# Patient Record
Sex: Male | Born: 1985
Health system: Southern US, Community
[De-identification: ages and names within clinical notes are randomized; demographics above are authoritative.]

## PROBLEM LIST (undated history)

## (undated) DIAGNOSIS — J302 Other seasonal allergic rhinitis: Secondary | ICD-10-CM

## (undated) DIAGNOSIS — I4510 Unspecified right bundle-branch block: Secondary | ICD-10-CM

## (undated) DIAGNOSIS — T7840XA Allergy, unspecified, initial encounter: Secondary | ICD-10-CM

## (undated) DIAGNOSIS — E786 Lipoprotein deficiency: Secondary | ICD-10-CM

## (undated) DIAGNOSIS — E785 Hyperlipidemia, unspecified: Secondary | ICD-10-CM

## (undated) HISTORY — PX: ADENOIDECTOMY: SUR15

## (undated) HISTORY — DX: Other seasonal allergic rhinitis: J30.2

## (undated) HISTORY — DX: Unspecified right bundle-branch block: I45.10

## (undated) HISTORY — DX: Lipoprotein deficiency: E78.6

## (undated) HISTORY — DX: Allergy, unspecified, initial encounter: T78.40XA

## (undated) HISTORY — DX: Hyperlipidemia, unspecified: E78.5

---
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
## 2007-09-03 ENCOUNTER — Ambulatory Visit: Payer: Self-pay | Admitting: Family Medicine

## 2008-09-15 ENCOUNTER — Ambulatory Visit: Payer: Self-pay | Admitting: Family Medicine

## 2011-09-23 ENCOUNTER — Ambulatory Visit (INDEPENDENT_AMBULATORY_CARE_PROVIDER_SITE_OTHER): Payer: BC Managed Care – PPO | Admitting: Medical

## 2011-09-23 ENCOUNTER — Encounter: Payer: Self-pay | Admitting: Medical

## 2011-09-23 VITALS — BP 140/80 | HR 72 | Temp 98.0°F | Ht 70.0 in | Wt 221.5 lb

## 2011-09-23 DIAGNOSIS — Z0000 Encounter for general adult medical examination without abnormal findings: Secondary | ICD-10-CM

## 2011-09-23 LAB — POCT URINALYSIS DIPSTICK
Bilirubin, UA: NEGATIVE
Blood, UA: NEGATIVE
Glucose, UA: NEGATIVE
Ketones, UA: NEGATIVE
Leukocytes, UA: NEGATIVE
Nitrite, UA: NEGATIVE
Protein, UA: NEGATIVE
Spec Grav, UA: 1.01
Urobilinogen, UA: NEGATIVE
pH, UA: 8

## 2011-09-23 NOTE — Patient Instructions (Signed)
Preventative Care for Adults, Male       REGULAR HEALTH EXAMS:  A routine yearly physical is a good way to check in with your primary care provider about your health and preventive screening. It is also an opportunity to share updates about your health and any concerns you have, and receive a thorough all-over exam.   Most health insurance companies pay for at least some preventative services.  Check with your health plan for specific coverages.  WHAT PREVENTATIVE SERVICES DO MEN NEED?  Adult men should have their weight and blood pressure checked regularly.   Men age 35 and older should have their cholesterol levels checked regularly.  Beginning at age 50 and continuing to age 75, men should be screened for colorectal cancer.  Certain people should may need continued testing until age 85.  Other cancer screening may include exams for testicular and prostate cancer.  Updating vaccinations is part of preventative care.  Vaccinations help protect against diseases such as the flu.  Lab tests are generally done as part of preventative care to screen for anemia and blood disorders, to screen for problems with the kidneys and liver, to screen for bladder problems, to check blood sugar, and to check your cholesterol level.  Preventative services generally include counseling about diet, exercise, avoiding tobacco, drugs, excessive alcohol consumption, and sexually transmitted infections.    GENERAL RECOMMENDATIONS FOR GOOD HEALTH:  Healthy diet:  Eat a variety of foods, including fruit, vegetables, animal or vegetable protein, such as meat, fish, chicken, and eggs, or beans, lentils, tofu, and grains, such as rice.  Drink plenty of water daily.  Decrease saturated fat in the diet, avoid lots of red meat, processed foods, sweets, fast foods, and fried foods.  Exercise:  Aerobic exercise helps maintain good heart health. At least 30-40 minutes of moderate-intensity exercise is recommended.  For example, a brisk walk that increases your heart rate and breathing. This should be done on most days of the week.   Find a type of exercise or a variety of exercises that you enjoy so that it becomes a part of your daily life.  Examples are running, walking, swimming, water aerobics, and biking.  For motivation and support, explore group exercise such as aerobic class, spin class, Zumba, Yoga,or  martial arts, etc.    Set exercise goals for yourself, such as a certain weight goal, walk or run in a race such as a 5k walk/run.  Speak to your primary care provider about exercise goals.  Disease prevention:  If you smoke or chew tobacco, find out from your caregiver how to quit. It can literally save your life, no matter how long you have been a tobacco user. If you do not use tobacco, never begin.   Maintain a healthy diet and normal weight. Increased weight leads to problems with blood pressure and diabetes.   The Body Mass Index or BMI is a way of measuring how much of your body is fat. Having a BMI above 27 increases the risk of heart disease, diabetes, hypertension, stroke and other problems related to obesity. Your caregiver can help determine your BMI and based on it develop an exercise and dietary program to help you achieve or maintain this important measurement at a healthful level.  High blood pressure causes heart and blood vessel problems.  Persistent high blood pressure should be treated with medicine if weight loss and exercise do not work.   Fat and cholesterol leaves deposits in your arteries   that can block them. This causes heart disease and vessel disease elsewhere in your body.  If your cholesterol is found to be high, or if you have heart disease or certain other medical conditions, then you may need to have your cholesterol monitored frequently and be treated with medication.   Ask if you should have a stress test if your history suggests this. A stress test is a test done on  a treadmill that looks for heart disease. This test can find disease prior to there being a problem.  Avoid drinking alcohol in excess (more than two drinks per day).  Avoid use of street drugs. Do not share needles with anyone. Ask for professional help if you need assistance or instructions on stopping the use of alcohol, cigarettes, and/or drugs.  Brush your teeth twice a day with fluoride toothpaste, and floss once a day. Good oral hygiene prevents tooth decay and gum disease. The problems can be painful, unattractive, and can cause other health problems. Visit your dentist for a routine oral and dental check up and preventive care every 6-12 months.   Look at your skin regularly.  Use a mirror to look at your back. Notify your caregivers of changes in moles, especially if there are changes in shapes, colors, a size larger than a pencil eraser, an irregular border, or development of new moles.  Safety:  Use seatbelts 100% of the time, whether driving or as a passenger.  Use safety devices such as hearing protection if you work in environments with loud noise or significant background noise.  Use safety glasses when doing any work that could send debris in to the eyes.  Use a helmet if you ride a bike or motorcycle.  Use appropriate safety gear for contact sports.  Talk to your caregiver about gun safety.  Use sunscreen with a SPF (or skin protection factor) of 15 or greater.  Lighter skinned people are at a greater risk of skin cancer. Don't forget to also wear sunglasses in order to protect your eyes from too much damaging sunlight. Damaging sunlight can accelerate cataract formation.   Practice safe sex. Use condoms. Condoms are used for birth control and to help reduce the spread of sexually transmitted infections (or STIs).  Some of the STIs are gonorrhea (the clap), chlamydia, syphilis, trichomonas, herpes, HPV (human papilloma virus) and HIV (human immunodeficiency virus) which causes AIDS.  The herpes, HIV and HPV are viral illnesses that have no cure. These can result in disability, cancer and death.   Keep carbon monoxide and smoke detectors in your home functioning at all times. Change the batteries every 6 months or use a model that plugs into the wall.   Vaccinations:  Stay up to date with your tetanus shots and other required immunizations. You should have a booster for tetanus every 10 years. Be sure to get your flu shot every year, since 5%-20% of the [place] population comes down with the flu. The flu vaccine changes each year, so being vaccinated once is not enough. Get your shot in the fall, before the flu season peaks.   Other vaccines to consider:  Pneumococcal vaccine to protect against certain types of pneumonia.  This is normally recommended for adults age 65 or older.  However, adults younger than 25 years old with certain underlying conditions such as diabetes, heart or lung disease should also receive the vaccine.  Shingles vaccine to protect against Varicella Zoster if you are older than age 60, or younger   than 25 years old with certain underlying illness.  Hepatitis A vaccine to protect against a form of infection of the liver by a virus acquired from food.  Hepatitis B vaccine to protect against a form of infection of the liver by a virus acquired from blood or body fluids, particularly if you work in health care.  If you plan to travel internationally, check with your local health department for specific vaccination recommendations.  Cancer Screening:  Most routine colon cancer screening begins at the age of 50. On a yearly basis, doctors may provide special easy to use take-home tests to check for hidden blood in the stool. Sigmoidoscopy or colonoscopy can detect the earliest forms of colon cancer and is life saving. These tests use a small camera at the end of a tube to directly examine the colon. Speak to your caregiver about this at age 50, when routine  screening begins (and is repeated every 5 years unless early forms of pre-cancerous polyps or small growths are found).   At the age of 50 men usually start screening for prostate cancer every year. Screening may begin at a younger age for those with higher risk. Those at higher risk include African-Americans or having a family history of prostate cancer. There are two types of tests for prostate cancer:   Prostate-specific antigen (PSA) testing. Recent studies raise questions about prostate cancer using PSA and you should discuss this with your caregiver.   Digital rectal exam (in which your doctor's lubricated and gloved finger feels for enlargement of the prostate through the anus).   Screening for testicular cancer.  Do a monthly exam of your testicles. Gently roll each testicle between your thumb and fingers, feeling for any abnormal lumps. The best time to do this is after a hot shower or bath when the tissues are looser. Notify your caregivers of any lumps, tenderness or changes in size or shape immediately.     

## 2011-09-23 NOTE — Progress Notes (Signed)
Subjective:   HPI  Cory Barron is a 25 y.o. male who presents for a complete physical.  He is a Company secretary and works with EMS as well.  He has been in his usual state of health without c/o.    Reviewed their medical, surgical, family, social, medication, and allergy history and updated chart as appropriate.  Past Medical History  Diagnosis Date  . Asthma     childhood asthma    History reviewed. No pertinent past surgical history.  Family History  Problem Relation Age of Onset  . Thyroid disease Mother   . Hypertension Father   . Asthma Brother   . Diabetes Maternal Aunt   . Cancer Maternal Grandmother     stomach cancer  . Diabetes Maternal Grandfather   . Cancer Paternal Grandfather     lung  . Heart disease Paternal Grandfather   . Stroke Neg Hx     History   Social History  . Marital Status: Single    Spouse Name: N/A    Number of Children: N/A  . Years of Education: N/A   Occupational History  . Not on file.   Social History Main Topics  . Smoking status: Never Smoker   . Smokeless tobacco: Former Neurosurgeon  . Alcohol Use: 0.5 oz/week    1 drink(s) per week  . Drug Use: No  . Sexually Active: Not on file     fireman, married, no children, exercise - some   Other Topics Concern  . Not on file   Social History Narrative  . No narrative on file    No current outpatient prescriptions on file prior to visit.    No Known Allergies   Review of Systems Constitutional: denies fever, chills, sweats, unexpected weight change, anorexia, fatigue Allergy: negative; denies recent sneezing, itching, congestion Dermatology: denies changing moles, rash, lumps, new worrisome lesions ENT: no runny nose, ear pain, sore throat, hoarseness, sinus pain, teeth pain, tinnitus, hearing loss, epistaxis Cardiology: denies chest pain, palpitations, edema, orthopnea, paroxysmal nocturnal dyspnea Respiratory: denies cough, shortness of breath, dyspnea on exertion, wheezing,  hemoptysis Gastroenterology: denies abdominal pain, nausea, vomiting, diarrhea, constipation, blood in stool, changes in bowel movement, dysphagia Hematology: denies bleeding or bruising problems Musculoskeletal: denies arthralgias, myalgias, joint swelling, back pain, neck pain, cramping, gait changes Ophthalmology: denies vision changes, eye redness, itching, discharge Urology: denies dysuria, difficulty urinating, hematuria, urinary frequency, urgency, incontinence Neurology: no headache, weakness, tingling, numbness, speech abnormality, memory loss, falls, dizziness Psychology: denies depressed mood, agitation, sleep problems     Objective:   Physical Exam  Filed Vitals:   09/23/11 1401  BP: 140/80  Pulse: 72  Temp: 98 F (36.7 C)    General appearance: alert, no distress, WD/WN, white male, looks stated age Skin: scattered benign appearing macules throughout, otherwise tattoos on bilat shoulders, chest, and upper back; fireman tattoo right shoulder, 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 On back HEENT: normocephalic, conjunctiva/corneas normal, sclerae anicteric, PERRLA, EOMi, nares patent, no discharge or erythema, pharynx normal Oral cavity: MMM, tongue normal, teeth in good repair Neck: supple, no lymphadenopathy, no thyromegaly, no masses, normal ROM, no bruits Chest: non tender, normal shape and expansion Heart: RRR, normal S1, S2, no murmurs Lungs: CTA bilaterally, no wheezes, rhonchi, or rales Abdomen: +bs, soft, non tender, non distended, no masses, no hepatomegaly, no splenomegaly, no bruits Back: non tender, normal ROM, no scoliosis Musculoskeletal: upper extremities non tender, no obvious deformity, normal ROM throughout, lower extremities non tender, no obvious deformity,  normal ROM throughout Extremities: no edema, no cyanosis, no clubbing Pulses: 2+ symmetric, upper and lower extremities, normal cap refill Neurological: alert, oriented x 3, CN2-12 intact, strength  normal upper extremities and lower extremities, sensation normal throughout, DTRs 2+ throughout, no cerebellar signs, gait normal Psychiatric: normal affect, behavior normal, pleasant  GU: normal male external genitalia, no mass, no hernia, no lesions   Assessment :    Encounter Diagnosis  Name Primary?  . Routine general medical examination at a health care facility Yes      Plan:    Physical exam - discussed healthy lifestyle, diet, exercise, preventative care, vaccinations, and addressed their concerns.   He will get flu shot through work soon. Tdap 09/03/07.     BP recheck today was 136/78 with large cuff.  Advised he try and lose 10-20 lb over the next 75mo.  I reviewed his fasting labs from 4/12 which were normal for CMET, CBC, Lipid, heavy metal screen.  Urinalysis normal today.    Recheck in 1 year.

## 2012-04-15 ENCOUNTER — Telehealth: Payer: Self-pay | Admitting: Medical

## 2012-04-16 NOTE — Telephone Encounter (Signed)
Make sure i have them.   i didn't see them on my desk

## 2012-04-20 ENCOUNTER — Encounter: Payer: Self-pay | Admitting: Medical

## 2012-09-23 ENCOUNTER — Encounter: Payer: Self-pay | Admitting: Internal Medicine

## 2012-09-29 ENCOUNTER — Encounter: Payer: Self-pay | Admitting: Medical

## 2012-09-29 ENCOUNTER — Ambulatory Visit (INDEPENDENT_AMBULATORY_CARE_PROVIDER_SITE_OTHER): Payer: BC Managed Care – PPO | Admitting: Medical

## 2012-09-29 VITALS — BP 118/80 | HR 92 | Temp 97.9°F | Resp 16 | Ht 69.2 in | Wt 212.0 lb

## 2012-09-29 DIAGNOSIS — L819 Disorder of pigmentation, unspecified: Secondary | ICD-10-CM

## 2012-09-29 DIAGNOSIS — Z0000 Encounter for general adult medical examination without abnormal findings: Secondary | ICD-10-CM

## 2012-09-29 DIAGNOSIS — Z113 Encounter for screening for infections with a predominantly sexual mode of transmission: Secondary | ICD-10-CM

## 2012-09-29 DIAGNOSIS — L818 Other specified disorders of pigmentation: Secondary | ICD-10-CM

## 2012-09-29 NOTE — Progress Notes (Signed)
Subjective:   HPI  Cory Barron is a 26 y.o. male who presents for a complete physical.  He is a Company secretary and works with EMS as well.  He has been in his usual state of health without c/o.    He does have questions above cancer screens.  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  Has questions about screening.  No other c/o.  Reviewed their medical, surgical, family, social, medication, and allergy history and updated chart as appropriate.  Past Medical History  Diagnosis Date  . Asthma     childhood asthma  . Seasonal allergic rhinitis     spring    Past Surgical History  Procedure Date  . Adenoidectomy     Family History  Problem Relation Age of Onset  . Thyroid disease Mother   . Hypertension Father   . Asthma Brother   . Diabetes Maternal Aunt   . Cancer Maternal Grandmother     stomach cancer  . Diabetes Maternal Grandfather   . Cancer Paternal Grandfather     lung  . Heart disease Paternal Grandfather   . Stroke Neg Hx   . Cancer Maternal Uncle     rectal    History   Social History  . Marital Status: Single    Spouse Name: N/A    Number of Children: N/A  . Years of Education: N/A   Occupational History  . fireman, emt    Social History Main Topics  . Smoking status: Never Smoker   . Smokeless tobacco: Former Neurosurgeon  . Alcohol Use: 0.5 oz/week    1 drink(s) per week  . Drug Use: No  . Sexually Active: Not on file     fireman, married, no children, exercise - some   Other Topics Concern  . Not on file   Social History Narrative   Married, active on job, some exercise outside of work, no children    Current Outpatient Prescriptions on File Prior to Visit  Medication Sig Dispense Refill  . Multiple Vitamins-Minerals (MULTIVITAMIN WITH MINERALS) tablet Take 1 tablet by mouth daily.          No Known Allergies   Review of Systems Constitutional: denies fever, chills,  sweats, unexpected weight change, anorexia, fatigue Allergy: negative; denies recent sneezing, itching, congestion Dermatology: denies changing moles, rash, lumps, new worrisome lesions ENT: no runny nose, ear pain, sore throat, hoarseness, sinus pain, teeth pain, tinnitus, hearing loss, epistaxis Cardiology: denies chest pain, palpitations, edema, orthopnea, paroxysmal nocturnal dyspnea Respiratory: denies cough, shortness of breath, dyspnea on exertion, wheezing, hemoptysis Gastroenterology: denies abdominal pain, nausea, vomiting, diarrhea, constipation, blood in stool, changes in bowel movement, dysphagia Hematology: denies bleeding or bruising problems Musculoskeletal: denies arthralgias, myalgias, joint swelling, back pain, neck pain, cramping, gait changes Ophthalmology: denies vision changes, eye redness, itching, discharge Urology: denies dysuria, difficulty urinating, hematuria, urinary frequency, urgency, incontinence Neurology: no headache, weakness, tingling, numbness, speech abnormality, memory loss, falls, dizziness Psychology: denies depressed mood, agitation, sleep problems     Objective:   Physical Exam  Filed Vitals:   09/29/12 1406  BP: 118/80  Pulse: 92  Temp: 97.9 F (36.6 C)  Resp: 16    General appearance: alert, no distress, WD/WN, white male Skin: scattered benign appearing macules throughout, otherwise tattoos on bilat shoulders, chest, and upper back; fireman tattoo right shoulder, 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 On back HEENT: normocephalic, conjunctiva/corneas normal, sclerae anicteric, PERRLA, EOMi,  nares patent, no discharge or erythema, pharynx normal Oral cavity: MMM, tongue normal, teeth in good repair Neck: supple, no lymphadenopathy, no thyromegaly, no masses, normal ROM, no bruits Chest: non tender, normal shape and expansion Heart: RRR, normal S1, S2, no murmurs Lungs: CTA bilaterally, no wheezes, rhonchi, or rales Abdomen: +bs, soft, non  tender, non distended, no masses, no hepatomegaly, no splenomegaly, no bruits Back: non tender, normal ROM, no scoliosis Musculoskeletal: upper extremities non tender, no obvious deformity, normal ROM throughout, lower extremities non tender, no obvious deformity, normal ROM throughout Extremities: no edema, no cyanosis, no clubbing Pulses: 2+ symmetric, upper and lower extremities, normal cap refill Neurological: alert, oriented x 3, CN2-12 intact, strength normal upper extremities and lower extremities, sensation normal throughout, DTRs 2+ throughout, no cerebellar signs, gait normal Psychiatric: normal affect, behavior normal, pleasant  GU: normal male external genitalia, no mass, no hernia, no lesions Rectal Deferred  Assessment :    Encounter Diagnoses  Name Primary?  . Routine general medical examination at a health care facility Yes  . Screen for STD (sexually transmitted disease)   . Tattoo of skin       Plan:    Physical exam - discussed healthy lifestyle, diet, exercise, preventative care, vaccinations, and addressed their concerns.   He will get flu shot through work soon. Tdap 09/03/07.   Pending hepatitis screening, will begin Hep A/B vaccination.  Will request labs from work (comprehensive panel).  STD screening today.  Low risk, but no prior screening.  Tattoo of skin - hepatitis screening today

## 2012-09-29 NOTE — Patient Instructions (Signed)
Preventative Care for Adults, Male       REGULAR HEALTH EXAMS:  A routine yearly physical is a good way to check in with your primary care provider about your health and preventive screening. It is also an opportunity to share updates about your health and any concerns you have, and receive a thorough all-over exam.   Most health insurance companies pay for at least some preventative services.  Check with your health plan for specific coverages.  WHAT PREVENTATIVE SERVICES DO MEN NEED?  Adult men should have their weight and blood pressure checked regularly.   Men age 26 and older should have their cholesterol levels checked regularly.  Beginning at age 26 and continuing to age 26, men should be screened for colorectal cancer.  Certain people should may need continued testing until age 26.  Other cancer screening may include exams for testicular and prostate cancer.  Updating vaccinations is part of preventative care.  Vaccinations help protect against diseases such as the flu.  Lab tests are generally done as part of preventative care to screen for anemia and blood disorders, to screen for problems with the kidneys and liver, to screen for bladder problems, to check blood sugar, and to check your cholesterol level.  Preventative services generally include counseling about diet, exercise, avoiding tobacco, drugs, excessive alcohol consumption, and sexually transmitted infections.    GENERAL RECOMMENDATIONS FOR GOOD HEALTH:  Healthy diet:  Eat a variety of foods, including fruit, vegetables, animal or vegetable protein, such as meat, fish, chicken, and eggs, or beans, lentils, tofu, and grains, such as rice.  Drink plenty of water daily.  Decrease saturated fat in the diet, avoid lots of red meat, processed foods, sweets, fast foods, and fried foods.  Exercise:  Aerobic exercise helps maintain good heart health. At least 30-40 minutes of moderate-intensity exercise is recommended.  For example, a brisk walk that increases your heart rate and breathing. This should be done on most days of the week.   Find a type of exercise or a variety of exercises that you enjoy so that it becomes a part of your daily life.  Examples are running, walking, swimming, water aerobics, and biking.  For motivation and support, explore group exercise such as aerobic class, spin class, Zumba, Yoga,or  martial arts, etc.    Set exercise goals for yourself, such as a certain weight goal, walk or run in a race such as a 5k walk/run.  Speak to your primary care provider about exercise goals.  Disease prevention:  If you smoke or chew tobacco, find out from your caregiver how to quit. It can literally save your life, no matter how long you have been a tobacco user. If you do not use tobacco, never begin.   Maintain a healthy diet and normal weight. Increased weight leads to problems with blood pressure and diabetes.   The Body Mass Index or BMI is a way of measuring how much of your body is fat. Having a BMI above 27 increases the risk of heart disease, diabetes, hypertension, stroke and other problems related to obesity. Your caregiver can help determine your BMI and based on it develop an exercise and dietary program to help you achieve or maintain this important measurement at a healthful level.  High blood pressure causes heart and blood vessel problems.  Persistent high blood pressure should be treated with medicine if weight loss and exercise do not work.   Fat and cholesterol leaves deposits in your arteries   that can block them. This causes heart disease and vessel disease elsewhere in your body.  If your cholesterol is found to be high, or if you have heart disease or certain other medical conditions, then you may need to have your cholesterol monitored frequently and be treated with medication.   Ask if you should have a stress test if your history suggests this. A stress test is a test done on  a treadmill that looks for heart disease. This test can find disease prior to there being a problem.  Avoid drinking alcohol in excess (more than two drinks per day).  Avoid use of street drugs. Do not share needles with anyone. Ask for professional help if you need assistance or instructions on stopping the use of alcohol, cigarettes, and/or drugs.  Brush your teeth twice a day with fluoride toothpaste, and floss once a day. Good oral hygiene prevents tooth decay and gum disease. The problems can be painful, unattractive, and can cause other health problems. Visit your dentist for a routine oral and dental check up and preventive care every 6-12 months.   Look at your skin regularly.  Use a mirror to look at your back. Notify your caregivers of changes in moles, especially if there are changes in shapes, colors, a size larger than a pencil eraser, an irregular border, or development of new moles.  Safety:  Use seatbelts 100% of the time, whether driving or as a passenger.  Use safety devices such as hearing protection if you work in environments with loud noise or significant background noise.  Use safety glasses when doing any work that could send debris in to the eyes.  Use a helmet if you ride a bike or motorcycle.  Use appropriate safety gear for contact sports.  Talk to your caregiver about gun safety.  Use sunscreen with a SPF (or skin protection factor) of 15 or greater.  Lighter skinned people are at a greater risk of skin cancer. Don't forget to also wear sunglasses in order to protect your eyes from too much damaging sunlight. Damaging sunlight can accelerate cataract formation.   Practice safe sex. Use condoms. Condoms are used for birth control and to help reduce the spread of sexually transmitted infections (or STIs).  Some of the STIs are gonorrhea (the clap), chlamydia, syphilis, trichomonas, herpes, HPV (human papilloma virus) and HIV (human immunodeficiency virus) which causes AIDS.  The herpes, HIV and HPV are viral illnesses that have no cure. These can result in disability, cancer and death.   Keep carbon monoxide and smoke detectors in your home functioning at all times. Change the batteries every 6 months or use a model that plugs into the wall.   Vaccinations:  Stay up to date with your tetanus shots and other required immunizations. You should have a booster for tetanus every 10 years. Be sure to get your flu shot every year, since 5%-20% of the [place] population comes down with the flu. The flu vaccine changes each year, so being vaccinated once is not enough. Get your shot in the fall, before the flu season peaks.   Other vaccines to consider:  Pneumococcal vaccine to protect against certain types of pneumonia.  This is normally recommended for adults age 65 or older.  However, adults younger than 26 years old with certain underlying conditions such as diabetes, heart or lung disease should also receive the vaccine.  Shingles vaccine to protect against Varicella Zoster if you are older than age 60, or younger   than 26 years old with certain underlying illness.  Hepatitis A vaccine to protect against a form of infection of the liver by a virus acquired from food.  Hepatitis B vaccine to protect against a form of infection of the liver by a virus acquired from blood or body fluids, particularly if you work in health care.  If you plan to travel internationally, check with your local health department for specific vaccination recommendations.  Cancer Screening:  Most routine colon cancer screening begins at the age of 50. On a yearly basis, doctors may provide special easy to use take-home tests to check for hidden blood in the stool. Sigmoidoscopy or colonoscopy can detect the earliest forms of colon cancer and is life saving. These tests use a small camera at the end of a tube to directly examine the colon. Speak to your caregiver about this at age 50, when routine  screening begins (and is repeated every 5 years unless early forms of pre-cancerous polyps or small growths are found).   At the age of 50 men usually start screening for prostate cancer every year. Screening may begin at a younger age for those with higher risk. Those at higher risk include African-Americans or having a family history of prostate cancer. There are two types of tests for prostate cancer:   Prostate-specific antigen (PSA) testing. Recent studies raise questions about prostate cancer using PSA and you should discuss this with your caregiver.   Digital rectal exam (in which your doctor's lubricated and gloved finger feels for enlargement of the prostate through the anus).   Screening for testicular cancer.  Do a monthly exam of your testicles. Gently roll each testicle between your thumb and fingers, feeling for any abnormal lumps. The best time to do this is after a hot shower or bath when the tissues are looser. Notify your caregivers of any lumps, tenderness or changes in size or shape immediately.     

## 2012-09-30 LAB — GC/CHLAMYDIA PROBE AMP, URINE: Chlamydia, Swab/Urine, PCR: NEGATIVE

## 2012-09-30 LAB — HEPATITIS B SURFACE ANTIBODY, QUANTITATIVE: Hepatitis B-Post: 0 m[IU]/mL

## 2012-09-30 LAB — HIV ANTIBODY (ROUTINE TESTING W REFLEX): HIV: NONREACTIVE

## 2012-10-05 ENCOUNTER — Encounter: Payer: Self-pay | Admitting: Medical

## 2012-10-07 ENCOUNTER — Telehealth: Payer: Self-pay | Admitting: Family Medicine

## 2012-10-07 ENCOUNTER — Encounter: Payer: Self-pay | Admitting: Medical

## 2012-10-07 NOTE — Telephone Encounter (Signed)
SHANE CAN YOU TAKE A LOOK AT THIS?

## 2012-10-09 NOTE — Telephone Encounter (Signed)
He is not immune to Hep B, and HIV, syphilis, gonorrhea, chlamydia negative.  Why did I not get Hep B surface Antigen, core antibody labs?  And why did the other labs not spill into my inbox like normal.    You can advise him of his labs thus far, but yes he will need Hep B vaccine, but I don't have all the labs back yet.  pls check on this.

## 2012-10-09 NOTE — Telephone Encounter (Signed)
LMOM FOR THE PATIENT ABOUT HIS LABS AND TO FOLLOW UP FOR HIS HEP B SERIES. CLS

## 2012-10-14 ENCOUNTER — Other Ambulatory Visit (INDEPENDENT_AMBULATORY_CARE_PROVIDER_SITE_OTHER): Payer: BC Managed Care – PPO

## 2012-10-14 DIAGNOSIS — Z23 Encounter for immunization: Secondary | ICD-10-CM

## 2012-11-11 ENCOUNTER — Other Ambulatory Visit (INDEPENDENT_AMBULATORY_CARE_PROVIDER_SITE_OTHER): Payer: BC Managed Care – PPO

## 2012-11-11 DIAGNOSIS — Z23 Encounter for immunization: Secondary | ICD-10-CM

## 2013-02-06 ENCOUNTER — Other Ambulatory Visit: Payer: Self-pay

## 2013-03-08 ENCOUNTER — Encounter: Payer: Self-pay | Admitting: Medical

## 2013-03-08 ENCOUNTER — Ambulatory Visit: Payer: BC Managed Care – PPO | Admitting: Medical

## 2013-03-08 VITALS — BP 132/78 | HR 62 | Temp 97.8°F | Resp 16 | Wt 226.0 lb

## 2013-03-08 DIAGNOSIS — Z23 Encounter for immunization: Secondary | ICD-10-CM

## 2013-03-08 DIAGNOSIS — M79609 Pain in unspecified limb: Secondary | ICD-10-CM

## 2013-03-08 DIAGNOSIS — S8991XA Unspecified injury of right lower leg, initial encounter: Secondary | ICD-10-CM

## 2013-03-08 DIAGNOSIS — Z029 Encounter for administrative examinations, unspecified: Secondary | ICD-10-CM

## 2013-03-08 NOTE — Patient Instructions (Signed)
Over the next 5-7 days, try and do some leg elevation for 20 minutes twice daily  Take Ibuprofen OTC 200mg , 3 tablets 3 times daily for the next week.  Try heat compresses.  You can also alternate ice and heat  Consider compression with ACE wrap or OTC Compression stockings.    Move, exercise some to keep blood flow moving.

## 2013-03-08 NOTE — Progress Notes (Signed)
Subjective: Here for form to be completed for physical fitness readiness.  He had a complete physical with me a few months ago.  He is here for his last Hep B vaccination in the series of 3.   He is a IT sales professional.   He has a question about a leg injury.  He was at a call last week and coming down a stair landing about 2 ft off the ground at a mobile home, the wood was rotted, and his right leg went through the stair hitting the broken boards and landing had on the ground.  He only has mild pain of right leg below knee at abrasion and bruise, but whole leg is bruised.   He just wanted it looked at to make sure its healing.   He doesn't really have any other pain, no pain with walking, weight bearing, no calve pain.   He did advise his employer of the injury which occurred approximately one week ago.  He initially did not go for care since it wasn't really bothering him.  He denies warmth, fever, SOB, dyspnea. No numbness, tingling.   Past Medical History  Diagnosis Date  . Asthma     childhood asthma  . Seasonal allergic rhinitis     spring   ROS as in subjective  Objective: Gen: wd, wn, nad Skin: right leg from below knee to foot with some mild purplish ecchymosis at right medial foot and heel, ecchymosis faint throughout right lower leg, but also yellowish coloration of the ecchymosis over most of the right lower leg, no warmth, no erythema, there is a small healing 4mm diameter abrasion or right leg medial and inferior to knee MSK: mild tenderness at right lower leg just inferior and medial to knee at site of abrasion, but no diffuse or other localized tenderness, knee and ankle nontender, legs with normal ROM Ext: mild generalized swelling or right lower leg, no palpable cord, -homans Pulses normal Neuro: neurovascularly intact LE  Assessment: Encounter Diagnoses  Name Primary?  . Leg injury, right, initial encounter Yes  . Need for hepatitis B vaccination   . Administrative encounter     Leg injury - advised he notify his employer that he was seen today for this in the event this doesn't resolve in a week.  He did make them aware of the injury prior.  Etiology seems to be soft tissue bruising and swelling from trauma.  Seems to be healing appropriately.  No obvious signs/symptoms of DVT.  Advised elevation, alternate ice/heat, Ibuprofen 600mg  TID, compression, and this should gradually resolve in another week.  If not improving, recheck.  Hep B # 3 given today.  Physical fitness form completed for him at his request.   F/u prn.

## 2013-03-11 ENCOUNTER — Other Ambulatory Visit: Payer: BC Managed Care – PPO

## 2013-10-12 ENCOUNTER — Ambulatory Visit (INDEPENDENT_AMBULATORY_CARE_PROVIDER_SITE_OTHER): Payer: BC Managed Care – PPO | Admitting: Medical

## 2013-10-12 ENCOUNTER — Encounter: Payer: Self-pay | Admitting: Medical

## 2013-10-12 VITALS — BP 122/80 | HR 92 | Temp 97.7°F | Resp 18 | Ht 70.0 in | Wt 222.0 lb

## 2013-10-12 DIAGNOSIS — Z113 Encounter for screening for infections with a predominantly sexual mode of transmission: Secondary | ICD-10-CM

## 2013-10-12 DIAGNOSIS — Z0000 Encounter for general adult medical examination without abnormal findings: Secondary | ICD-10-CM

## 2013-10-12 DIAGNOSIS — Z569 Unspecified problems related to employment: Secondary | ICD-10-CM

## 2013-10-12 DIAGNOSIS — Z579 Occupational exposure to unspecified risk factor: Secondary | ICD-10-CM

## 2013-10-12 LAB — POCT URINALYSIS DIPSTICK
Bilirubin, UA: NEGATIVE
Blood, UA: NEGATIVE
Ketones, UA: NEGATIVE
Leukocytes, UA: NEGATIVE
Protein, UA: NEGATIVE
Spec Grav, UA: <=1.005
pH, UA: 8

## 2013-10-12 NOTE — Progress Notes (Signed)
Subjective:   HPI  Cory Barron is a 27 y.o. male who presents for a complete physical.  Doing well in general.   Expecting his first child in April 2015.   Firefighter, and also in school for additional fire training.   Preventative care: Last ophthalmology visit:N/A Last dental visit:YES [facility] Last colonoscopy:N/A Last prostate exam: N/A Last EKG:02/25/13 Last labs:2013  Prior vaccinations: TD or Tdap:2008 Influenza:WILL GET AT WORK Pneumococcal:N/A Shingles/Zostavax:N/A Other: N/A  Concerns: He worries about reports of other firefighters getting early cancers.   Wants all the appropriate screens yearly that are necessary.  Reviewed their medical, surgical, family, social, medication, and allergy history and updated chart as appropriate.  Past Medical History  Diagnosis Date  . Asthma     childhood asthma  . Seasonal allergic rhinitis     spring    Past Surgical History  Procedure Laterality Date  . Adenoidectomy      History   Social History  . Marital Status: Single    Spouse Name: N/A    Number of Children: N/A  . Years of Education: N/A   Occupational History  . fireman, emt    Social History Main Topics  . Smoking status: Never Smoker   . Smokeless tobacco: Former Neurosurgeon  . Alcohol Use: 0.5 oz/week    1 drink(s) per week  . Drug Use: No  . Sexual Activity: Not on file     Comment: fireman, married, no children, exercise - some   Other Topics Concern  . Not on file   Social History Narrative   Married, active on job, some exercise outside of work, no children yet, Wife due in April 2015.  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  Finishes in 1.5years (2016).    Family History  Problem Relation Age of Onset  . Thyroid disease Mother   . Hypertension Father   . Asthma Brother   . Diabetes Maternal Aunt   . Cancer Maternal Grandmother     stomach cancer  . Diabetes Maternal Grandfather   . Cancer  Paternal Grandfather     lung  . Heart disease Paternal Grandfather   . Stroke Neg Hx   . Cancer Maternal Uncle     rectal    Current outpatient prescriptions:Multiple Vitamins-Minerals (MULTIVITAMIN WITH MINERALS) tablet, Take 1 tablet by mouth daily.  , Disp: , Rfl:   No Known Allergies   Review of Systems Constitutional: -fever, -chills, -sweats, -unexpected weight change, -decreased appetite, -fatigue Allergy: -sneezing, -itching, -congestion Dermatology: -changing moles, --rash, -lumps ENT: -runny nose, -ear pain, -sore throat, -hoarseness, -sinus pain, -teeth pain, - ringing in ears, -hearing loss, -nosebleeds Cardiology: -chest pain, -palpitations, -swelling, -difficulty breathing when lying flat, -waking up short of breath Respiratory: -cough, -shortness of breath, -difficulty breathing with exercise or exertion, -wheezing, -coughing up blood Gastroenterology: -abdominal pain, -nausea, -vomiting, -diarrhea, -constipation, -blood in stool, -changes in bowel movement, -difficulty swallowing or eating Hematology: -bleeding, -bruising  Musculoskeletal: -joint aches, -muscle aches, -joint swelling, -back pain, -neck pain, -cramping, -changes in gait Ophthalmology: denies vision changes, eye redness, itching, discharge Urology: -burning with urination, -difficulty urinating, -blood in urine, -urinary frequency, -urgency, -incontinence Neurology: -headache, -weakness, -tingling, -numbness, -memory loss, -falls, -dizziness Psychology: -depressed mood, -agitation, -sleep problems     Objective:   Physical Exam  BP 122/80  Pulse 92  Temp(Src) 97.7 F (36.5 C) (Oral)  Resp 18  Ht 5\' 10"  (1.778 m)  Wt 222 lb (  100.699 kg)  BMI 31.85 kg/m2   General appearance: alert, no distress, WD/WN, white male  Skin: scattered benign appearing macules throughout, otherwise tattoos on bilat shoulders, chest, and upper back; fireman tattoo right shoulder, 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  On back  HEENT: normocephalic, conjunctiva/corneas normal, sclerae anicteric, PERRLA, EOMi, nares patent, no discharge or erythema, pharynx normal  Oral cavity: MMM, tongue normal, teeth in good repair  Neck: supple, no lymphadenopathy, no thyromegaly, no masses, normal ROM, no bruits  Chest: non tender, normal shape and expansion  Heart: RRR, normal S1, S2, no murmurs  Lungs: CTA bilaterally, no wheezes, rhonchi, or rales  Abdomen: +bs, soft, non tender, non distended, no masses, no hepatomegaly, no splenomegaly, no bruits  Back: non tender, normal ROM, no scoliosis  Musculoskeletal: upper extremities non tender, no obvious deformity, normal ROM throughout, lower extremities non tender, no obvious deformity, normal ROM throughout  Extremities: no edema, no cyanosis, no clubbing  Pulses: 2+ symmetric, upper and lower extremities, normal cap refill  Neurological: alert, oriented x 3, CN2-12 intact, strength normal upper extremities and lower extremities, sensation normal throughout, DTRs 2+ throughout, no cerebellar signs, gait normal  Psychiatric: normal affect, behavior normal, pleasant  GU: normal male external genitalia, circumcised, no mass, no hernia, no lesions  Rectal Deferred   Assessment and Plan :    Encounter Diagnoses  Name Primary?  . Routine general medical examination at a health care facility Yes  . Venereal disease screening   . Occupational exposure in workplace    Physical exam - discussed healthy lifestyle, diet, exercise, preventative care, vaccinations, and addressed their concerns.   Reviewed his work based general labs from 03/2013, additional labs due to occupational exposures today.   Will research other screening and get back in touch with him. Follow-up pending labs.

## 2013-10-13 LAB — HEPATITIS B SURFACE ANTIGEN: Hepatitis B Surface Ag: NEGATIVE

## 2013-10-14 ENCOUNTER — Telehealth: Payer: Self-pay | Admitting: Medical

## 2013-10-14 NOTE — Telephone Encounter (Signed)
Let him know that I have reviewed his email and links.  I have already spoken with [facility], am awaiting call back from Dr. Cyndie Chime, one of our wonderful local oncologists.  I will call him back when I get some feedback.  If he hasn't heard from me in 2 wk, then call back.

## 2013-10-15 NOTE — Telephone Encounter (Signed)
Left message for pt word by word

## 2013-10-28 ENCOUNTER — Other Ambulatory Visit: Payer: Self-pay

## 2013-11-02 ENCOUNTER — Telehealth: Payer: Self-pay | Admitting: Medical

## 2013-11-08 ENCOUNTER — Ambulatory Visit
Admission: RE | Admit: 2013-11-08 | Discharge: 2013-11-08 | Disposition: A | Discharge status: Home / Self Care | Payer: No Typology Code available for payment source | Source: Ambulatory Visit | Attending: Occupational Medicine | Admitting: Occupational Medicine

## 2013-11-08 ENCOUNTER — Other Ambulatory Visit: Payer: Self-pay | Admitting: Occupational Medicine

## 2013-11-08 DIAGNOSIS — Z021 Encounter for pre-employment examination: Secondary | ICD-10-CM

## 2013-11-08 NOTE — Telephone Encounter (Signed)
I have had a chance to speak to both Employee health as well as Dr. Cyndie Chime who is a well respected oncologist hear in [place].  With both folks I discussed some of the resources he mentioned, some of the recommended tests that were listed, and there is no consensus.  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  The oncologist recommended against routine chest x-rays or tumor marker blood tests.  Looking over his resources he gave me, they recommend yearly panel of labs to include liver, kidney, blood count, cholesterol, yearly PFTs, chest x-ray every 3 years, colonoscopy beginning age 58 instead of age 54, yearly testicular exams, Pap and mammograms for females, they also recommended screening labs for lymphoma bladder cancer and multiple myeloma.   I would say that we currently do this because we check a urine for blood which is a screen for bladder cancer, we check a CBC with differential which is a screen for lymphoma as well as a physical exam of lymph nodes, we check a metabolic panel which looks for elevated calcium which is one of the tests were used to screen for multiple myeloma, and we did a testicular exam. I believe he has had a PFT within the last year. If not we can certainly do that if he has any concern. We also checked an HIV and hepatitis lab.  Otherwise I would not recommending any other tests at this time since there is no good data on the value of the screenings.  See if this helps answer his questions.  If he learns of other more concrete guidelines going forward then we can certainly re look at this.

## 2013-11-12 ENCOUNTER — Encounter: Payer: Self-pay | Admitting: Family Medicine

## 2013-11-12 NOTE — Telephone Encounter (Signed)
Patient is aware. He saw everything on [facility] and he will call back if has any further questions. CLS

## 2013-11-12 NOTE — Telephone Encounter (Signed)
lmom to cb. CLS 

## 2013-12-23 DIAGNOSIS — E786 Lipoprotein deficiency: Secondary | ICD-10-CM

## 2013-12-23 HISTORY — DX: Lipoprotein deficiency: E78.6

## 2014-02-25 ENCOUNTER — Encounter: Payer: Self-pay | Admitting: Medical

## 2014-02-25 ENCOUNTER — Ambulatory Visit (INDEPENDENT_AMBULATORY_CARE_PROVIDER_SITE_OTHER): Payer: 59 | Admitting: Medical

## 2014-02-25 VITALS — BP 120/80 | HR 64 | Temp 98.4°F | Resp 14 | Wt 203.0 lb

## 2014-02-25 DIAGNOSIS — K5289 Other specified noninfective gastroenteritis and colitis: Secondary | ICD-10-CM

## 2014-02-25 DIAGNOSIS — R112 Nausea with vomiting, unspecified: Secondary | ICD-10-CM

## 2014-02-25 DIAGNOSIS — K529 Noninfective gastroenteritis and colitis, unspecified: Secondary | ICD-10-CM

## 2014-02-25 DIAGNOSIS — R197 Diarrhea, unspecified: Secondary | ICD-10-CM

## 2014-02-25 MED ORDER — ONDANSETRON HCL 4 MG PO TABS: 4.0000 mg | ORAL_TABLET | Freq: Three times a day (TID) | ORAL | Status: DC | PRN

## 2014-02-25 NOTE — Patient Instructions (Signed)
Thank you for giving me the opportunity to serve you today.    Your diagnosis today includes: Encounter Diagnoses  Name Primary?  . Gastroenteritis Yes  . Nausea with vomiting   . Diarrhea      Specific recommendations today include:  Over the weekend you can use Pepto Bismol or Imodium if not fever or blood in the stool  Use BRAT diet/bland foods such as bread, rice, applesauce toast  Avoid spicy and greasy foods  For nausea, you can use Emetrol or Benadryl OTC, or the Zofran I sent to the pharmacy  Wash hands frequently  Follow up: if needed   I have included other useful information below for your review.   Viral Gastroenteritis Viral gastroenteritis is also known as stomach flu. This condition affects the stomach and intestinal tract. It can cause sudden diarrhea and vomiting. The illness typically lasts 3 to 8 days. Most people develop an immune response that eventually gets rid of the virus. While this natural response develops, the virus can make you quite ill. CAUSES  Many different viruses can cause gastroenteritis, such as rotavirus or noroviruses. You can catch one of these viruses by consuming contaminated food or water. You may also catch a virus by sharing utensils or other personal items with an infected person or by touching a contaminated surface. SYMPTOMS  The most common symptoms are diarrhea and vomiting. These problems can cause a severe loss of body fluids (dehydration) and a body salt (electrolyte) imbalance. Other symptoms may include:  Fever.  Headache.  Fatigue.  Abdominal pain. DIAGNOSIS  Your caregiver can usually diagnose viral gastroenteritis based on your symptoms and a physical exam. A stool sample may also be taken to test for the presence of viruses or other infections. TREATMENT  This illness typically goes away on its own. Treatments are aimed at rehydration. The most serious cases of viral gastroenteritis involve vomiting so  severely that you are not able to keep fluids down. In these cases, fluids must be given through an intravenous line (IV). HOME CARE INSTRUCTIONS   Drink enough fluids to keep your urine clear or pale yellow. Drink small amounts of fluids frequently and increase the amounts as tolerated.  Ask your caregiver for specific rehydration instructions.  Avoid:  Foods high in sugar.  Alcohol.  Carbonated drinks.  Tobacco.  Juice.  Caffeine drinks.  Extremely hot or cold fluids.  Fatty, greasy foods.  Too much intake of anything at one time.  Dairy products until 24 to 48 hours after diarrhea stops.  You may consume probiotics. Probiotics are active cultures of beneficial bacteria. They may lessen the amount and number of diarrheal stools in adults. Probiotics can be found in yogurt with active cultures and in supplements.  Wash your hands well to avoid spreading the virus.  Only take over-the-counter or prescription medicines for pain, discomfort, or fever as directed by your caregiver. Do not give aspirin to children. Antidiarrheal medicines are not recommended.  Ask your caregiver if you should continue to take your regular prescribed and over-the-counter medicines.  Keep all follow-up appointments as directed by your caregiver. SEEK IMMEDIATE MEDICAL CARE IF:   You are unable to keep fluids down.  You do not urinate at least once every 6 to 8 hours.  You develop shortness of breath.  You notice blood in your stool or vomit. This may look like coffee grounds.  You have abdominal pain that increases or is concentrated in one small area (localized).  You have persistent vomiting or diarrhea.  You have a fever.  The patient is a child younger than 3 months, and he or she has a fever.  The patient is a child older than 3 months, and he or she has a fever and persistent symptoms.  The patient is a child older than 3 months, and he or she has a fever and symptoms  suddenly get worse.  The patient is a baby, and he or she has no tears when crying. MAKE SURE YOU:   Understand these instructions.  Will watch your condition.  Will get help right away if you are not doing well or get worse. Document Released: 12/09/2005 Document Revised: 03/02/2012 Document Reviewed: 09/25/2011 [facility] Patient Information 2014 [place].

## 2014-02-25 NOTE — Progress Notes (Signed)
   Subjective:   Cory Barron is a 28 y.o. male presenting on 02/25/2014 with Nausea, Emesis and Diarrhea  Been having symptoms for a week, and since this has persisted, wanted to come in.    In the last 24 hours, has had at least 5 x of diarrhea, loose watery, no blood.  No fever.  Vomited 2 x in the last 24 hours.   Vomiting started yesterday.  Has had diarrhea several times daily the last week.   Wednesday this week felt a little better, but things have persisted.   Denies fever, body aches, chills. No recent antibiotics, no recent travel.  No sick contacts with similar. No recent camping.   Doesn't recall any foods to cause food poisoning.   No hx/o IBS or bowel problems.  No other aggravating or relieving factors.  No other complaint.  Review of Systems ROS as in subjective      Objective:    Filed Vitals:   02/25/14 1515  BP: 120/80  Pulse: 64  Temp: 98.4 F (36.9 C)  Resp: 14    General appearance: alert, no distress, WD/WN HEENT: normocephalic, sclerae anicteric, TMs pearly, nares patent, no discharge or erythema, pharynx normal Oral cavity: MMM, no lesions Neck: supple, no lymphadenopathy, no thyromegaly, no masses Heart: RRR, normal S1, S2, no murmurs Lungs: CTA bilaterally, no wheezes, rhonchi, or rales Abdomen: +bs, soft, non tender, non distended, no masses, no hepatomegaly, no splenomegaly Pulses: 2+ symmetric, upper and lower extremities, normal cap refill      Assessment: Encounter Diagnoses  Name Primary?  . Gastroenteritis Yes  . Nausea with vomiting   . Diarrhea      Plan: Given no red flag symptoms and normal exam, will still presume viral gastroenteritis.  Gave recommendations including hydration, bland diet, small portions, avoid greasy and fried foods, can use Pepto Bismol, rest,probiotic, and if not back to normal by Monday, call back.   Cory Barron was seen today for nausea, emesis and diarrhea.  Diagnoses and associated orders for this  visit:  Gastroenteritis  Nausea with vomiting  Diarrhea  Other Orders - ondansetron (ZOFRAN) 4 MG tablet; Take 1 tablet (4 mg total) by mouth every 8 (eight) hours as needed for nausea or vomiting.     Return if symptoms worsen or fail to improve.

## 2014-05-24 ENCOUNTER — Encounter: Payer: Self-pay | Admitting: Medical

## 2014-06-14 ENCOUNTER — Encounter: Payer: Self-pay | Admitting: Medical

## 2014-10-17 ENCOUNTER — Ambulatory Visit (INDEPENDENT_AMBULATORY_CARE_PROVIDER_SITE_OTHER): Payer: 59 | Admitting: Medical

## 2014-10-17 ENCOUNTER — Encounter: Payer: Self-pay | Admitting: Medical

## 2014-10-17 VITALS — BP 120/70 | HR 80 | Temp 98.1°F | Resp 16 | Ht 69.2 in | Wt 219.0 lb

## 2014-10-17 DIAGNOSIS — Z0000 Encounter for general adult medical examination without abnormal findings: Secondary | ICD-10-CM

## 2014-10-17 DIAGNOSIS — Z7729 Contact with and (suspected ) exposure to other hazardous substances: Secondary | ICD-10-CM

## 2014-10-17 LAB — POCT URINALYSIS DIPSTICK
BILIRUBIN UA: NEGATIVE
Blood, UA: NEGATIVE
GLUCOSE UA: NEGATIVE
Ketones, UA: NEGATIVE
LEUKOCYTES UA: NEGATIVE
Nitrite, UA: NEGATIVE
Protein, UA: NEGATIVE
SPEC GRAV UA: 1.015
Urobilinogen, UA: NEGATIVE
pH, UA: 7

## 2014-10-17 NOTE — Progress Notes (Signed)
Subjective:   HPI  Cory Barron is a 28 y.o. male who presents for a complete physical.   Preventative care: Last ophthalmology visit: n/a Last dental visit:yes Last colonoscopy:n/a Last prostate exam: n/a Last EKG:03/2014 Last labs: 02/2014, gets yealry panel of labs, spirometry and EKG at work in spring.  Prior vaccinations: TD or Tdap:2008 Influenza:2015 at work Pneumococcal:n/a Shingles/Zostavax:n/a  Advanced directive:n/a [place] will:n/a  Concerns: None, doing fine, has a 73mo son.  Reviewed their medical, surgical, family, social, medication, and allergy history and updated chart as appropriate.  Past Medical History  Diagnosis Date  . Asthma     childhood asthma  . Seasonal allergic rhinitis     spring  . RBBB     normal variant on EKG    Past Surgical History  Procedure Laterality Date  . Adenoidectomy      History   Social History  . Marital Status: Single    Spouse Name: N/A    Number of Children: N/A  . Years of Education: N/A   Occupational History  . fireman, emt    Social History Main Topics  . Smoking status: Never Smoker   . Smokeless tobacco: Former Systems developer  . Alcohol Use: 0.5 oz/week    1 drink(s) per week  . Drug Use: No  . Sexual Activity: Not on file     Comment: fireman, married, no children, exercise - some   Other Topics Concern  . 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    Family History  Problem Relation Age of Onset  . Thyroid disease Mother   . Hypertension Father   . Asthma Brother   . Diabetes Maternal Aunt   . Cancer Maternal Grandmother     stomach cancer  . Diabetes Maternal Grandfather   . Cancer Paternal Grandfather     lung  . Heart disease Paternal Grandfather   . Stroke Neg Hx   .  Cancer Maternal Uncle     rectal    Current outpatient prescriptions:Multiple Vitamins-Minerals (MULTIVITAMIN WITH MINERALS) tablet, Take 1 tablet by mouth daily.  , Disp: , Rfl:   No Known Allergies   Review of Systems Constitutional: -fever, -chills, -sweats, -unexpected weight change, -decreased appetite, -fatigue Allergy: -sneezing, -itching, -congestion Dermatology: -changing moles, --rash, -lumps ENT: -runny nose, -ear pain, -sore throat, -hoarseness, -sinus pain, -teeth pain, - ringing in ears, -hearing loss, -nosebleeds Cardiology: -chest pain, -palpitations, -swelling, -difficulty breathing when lying flat, -waking up short of breath Respiratory: -cough, -shortness of breath, -difficulty breathing with exercise or exertion, -wheezing, -coughing up blood Gastroenterology: -abdominal pain, -nausea, -vomiting, -diarrhea, -constipation, -blood in stool, -changes in bowel movement, -difficulty swallowing or eating Hematology: -bleeding, -bruising  Musculoskeletal: -joint aches, -muscle aches, -joint swelling, -back pain, -neck pain, -cramping, -changes in gait Ophthalmology: denies vision changes, eye redness, itching, discharge Urology: -burning with urination, -difficulty urinating, -blood in urine, -urinary frequency, -urgency, -incontinence Neurology: -headache, -weakness, -tingling, -numbness, -memory loss, -falls, -dizziness Psychology: -depressed mood, -agitation, -sleep problems     Objective:   Physical Exam  BP 120/70  Pulse 80  Temp(Src) 98.1 F (36.7 C) (Oral)  Resp 16  Ht 5' 9.2" (1.758 m)  Wt 219 lb (99.338 kg)  BMI 32.14 kg/m2  Wt Readings from Last 3 Encounters:  10/17/14 219 lb (99.338 kg)  02/25/14 203 lb (92.08 kg)  10/12/13 222 lb (100.699 kg)   General appearance: alert, no distress, WD/WN, white male  Skin: scattered benign appearing macules throughout, otherwise tattoos on bilat shoulders, chest, and upper back; fireman tattoo right shoulder, man  with outstretched arms/Jesus? On back  HEENT: normocephalic, conjunctiva/corneas normal, sclerae anicteric, PERRLA, EOMi, nares patent, no discharge or erythema, pharynx normal  Oral cavity: MMM, tongue normal, teeth in good repair  Neck: supple, no lymphadenopathy, no thyromegaly, no masses, normal ROM, no bruits  Chest: non tender, normal shape and expansion  Heart: RRR, normal S1, S2, no murmurs  Lungs: CTA bilaterally, no wheezes, rhonchi, or rales  Abdomen: +bs, soft, non tender, non distended, no masses, no hepatomegaly, no splenomegaly, no bruits  Back: non tender, normal ROM, no scoliosis  Musculoskeletal: upper extremities non tender, no obvious deformity, normal ROM throughout, lower extremities non tender, no obvious deformity, normal ROM throughout  Extremities: no edema, no cyanosis, no clubbing  Pulses: 2+ symmetric, upper and lower extremities, normal cap refill  Neurological: alert, oriented x 3, CN2-12 intact, strength normal upper extremities and lower extremities, sensation normal throughout, DTRs 2+ throughout, no cerebellar signs, gait normal  Psychiatric: normal affect, behavior normal, pleasant  GU: normal male external genitalia, circumcised, no mass, no hernia, no lesions  Rectal Deferred    Assessment and Plan :    Encounter Diagnoses  Name Primary?  . Encounter for health maintenance examination in adult Yes  . Exposure to hazardous substance     Physical exam - discussed healthy lifestyle, diet, exercise, preventative care, vaccinations, and addressed their concerns.   Congratulated him on his son's birth.    Labs today due to health exposures of his job.  Reviewed EKG and labs from 02/2014 health screen at work. See your dentist yearly for routine dental care including hygiene visits twice yearly. Work on losing some weight. Follow-up pending labs

## 2014-10-18 LAB — RPR

## 2014-10-18 LAB — HEPATITIS C ANTIBODY: HCV Ab: NEGATIVE

## 2014-10-18 LAB — HEPATITIS B SURFACE ANTIBODY, QUANTITATIVE: Hepatitis B-Post: 86.4 m[IU]/mL

## 2014-10-18 LAB — HIV ANTIBODY (ROUTINE TESTING W REFLEX): HIV: NONREACTIVE

## 2015-04-03 LAB — PULMONARY FUNCTION TEST

## 2015-04-04 ENCOUNTER — Telehealth: Payer: Self-pay | Admitting: Family Medicine

## 2015-04-04 NOTE — Telephone Encounter (Signed)
Pt stopped by and dropped off labs and ekg. He had these done at work and wanted a copy for his file. I am sending back in your folder.

## 2015-04-11 ENCOUNTER — Encounter: Payer: Self-pay | Admitting: Medical

## 2015-10-25 ENCOUNTER — Ambulatory Visit (INDEPENDENT_AMBULATORY_CARE_PROVIDER_SITE_OTHER): Payer: Commercial Managed Care - HMO | Admitting: Medical

## 2015-10-25 ENCOUNTER — Encounter: Payer: Self-pay | Admitting: Medical

## 2015-10-25 VITALS — BP 130/80 | HR 68 | Temp 98.0°F | Ht 69.25 in | Wt 208.0 lb

## 2015-10-25 DIAGNOSIS — E786 Lipoprotein deficiency: Secondary | ICD-10-CM

## 2015-10-25 DIAGNOSIS — Z0000 Encounter for general adult medical examination without abnormal findings: Secondary | ICD-10-CM

## 2015-10-25 LAB — POCT URINALYSIS DIPSTICK
Bilirubin, UA: NEGATIVE
GLUCOSE UA: NEGATIVE
KETONES UA: NEGATIVE
Leukocytes, UA: NEGATIVE
Nitrite, UA: NEGATIVE
Protein, UA: NEGATIVE
RBC UA: NEGATIVE
Spec Grav, UA: 1.025
UROBILINOGEN UA: NEGATIVE
pH, UA: 7

## 2015-10-25 NOTE — Progress Notes (Signed)
Subjective:   HPI  Cory Barron is a 29 y.o. male who presents for a complete physical.   Preventative care: Last ophthalmology visit: gets eye exam through work Last dental visit:yes Last colonoscopy:n/a Last prostate exam: n/a Last EKG:03/2015 Last labs: 02/2014, gets yearly panel of labs, spirometry and EKG at work in spring.  Prior vaccinations: TD or Tdap:2008 Influenza:2016 at work Pneumococcal:n/a Shingles/Zostavax:n/a  Advanced directive:n/a [place] will:n/a  Concerns: None, doing fine, has a 48mo son.  Reviewed their medical, surgical, family, social, medication, and allergy history and updated chart as appropriate.  Past Medical History  Diagnosis Date  . Seasonal allergic rhinitis     spring  . RBBB     normal variant on EKG  . Asthma     childhood asthma  . Low HDL (under 40) 2015    Past Surgical History  Procedure Laterality Date  . Adenoidectomy      Social History   Social History  . Marital Status: Single    Spouse Name: N/A  . Number of Children: N/A  . Years of Education: N/A   Occupational History  . fireman, emt    Social History Main Topics  . Smoking status: Never Smoker   . Smokeless tobacco: Former Systems developer  . Alcohol Use: 0.5 oz/week    1 Standard drinks or equivalent per week  . Drug Use: No  . Sexual Activity: Not on file     Comment: fireman, married, no children, exercise - some   Other Topics Concern  . 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  Lives in [place].  Wife is nurse at [employer]    Family History  Problem Relation Age of Onset  . Thyroid disease Mother   . Hypertension Father   . Asthma Brother   . Diabetes Maternal Aunt   . Cancer Maternal Grandmother     stomach cancer  .  Diabetes Maternal Grandfather   . Cancer Paternal Grandfather     lung  . Heart disease Paternal Grandfather   . Stroke Neg Hx   . Cancer Maternal Uncle     rectal     Current outpatient prescriptions:  Marland Kitchen  Multiple Vitamins-Minerals (MULTIVITAMIN WITH MINERALS) tablet, Take 1 tablet by mouth daily.  , Disp: , Rfl:   No Known Allergies   Review of Systems Constitutional: -fever, -chills, -sweats, -unexpected weight change, -decreased appetite, -fatigue Allergy: -sneezing, -itching, -congestion Dermatology: -changing moles, --rash, -lumps ENT: -runny nose, -ear pain, -sore throat, -hoarseness, -sinus pain, -teeth pain, - ringing in ears, -hearing loss, -nosebleeds Cardiology: -chest pain, -palpitations, -swelling, -difficulty breathing when lying flat, -waking up short of breath Respiratory: -cough, -shortness of breath, -difficulty breathing with exercise or exertion, -wheezing, -coughing up blood Gastroenterology: -abdominal pain, -nausea, -vomiting, -diarrhea, -constipation, -blood in stool, -changes in bowel movement, -difficulty swallowing or eating Hematology: -bleeding, -bruising  Musculoskeletal: -joint aches, -muscle aches, -joint swelling, -back pain, -neck pain, -cramping, -changes in gait Ophthalmology: denies vision changes, eye redness, itching, discharge Urology: -burning with urination, -difficulty urinating, -blood in urine, -urinary frequency, -urgency, -incontinence Neurology: -headache, -weakness, -tingling, -numbness, -memory loss, -falls, -dizziness Psychology: -depressed mood, -agitation, -sleep problems     Objective:   Physical Exam  BP 130/80 mmHg  Pulse 68  Temp(Src) 98 F (36.7  C) (Oral)  Ht 5' 9.25" (1.759 m)  Wt 208 lb (94.348 kg)  BMI 30.49 kg/m2  SpO2 98%  Wt Readings from Last 3 Encounters:  10/25/15 208 lb (94.348 kg)  10/17/14 219 lb (99.338 kg)  02/25/14 203 lb (92.08 kg)   General appearance: alert, no distress, WD/WN, white male   Skin: scattered benign appearing macules throughout, otherwise tattoos on bilat shoulders, chest, and upper back; fireman tattoo right shoulder, man with outstretched arms/Jesus? On back  HEENT: normocephalic, conjunctiva/corneas normal, sclerae anicteric, PERRLA, EOMi, nares patent, no discharge or erythema, pharynx normal  Oral cavity: MMM, tongue normal, teeth in good repair  Neck: supple, no lymphadenopathy, no thyromegaly, no masses, normal ROM, no bruits  Chest: non tender, normal shape and expansion  Heart: RRR, normal S1, S2, no murmurs  Lungs: CTA bilaterally, no wheezes, rhonchi, or rales  Abdomen: +bs, soft, non tender, non distended, no masses, no hepatomegaly, no splenomegaly, no bruits  Back: non tender, normal ROM, no scoliosis  Musculoskeletal: upper extremities non tender, no obvious deformity, normal ROM throughout, lower extremities non tender, no obvious deformity, normal ROM throughout  Extremities: no edema, no cyanosis, no clubbing  Pulses: 2+ symmetric, upper and lower extremities, normal cap refill  Neurological: alert, oriented x 3, CN2-12 intact, strength normal upper extremities and lower extremities, sensation normal throughout, DTRs 2+ throughout, no cerebellar signs, gait normal  Psychiatric: normal affect, behavior normal, pleasant  GU: normal male external genitalia, circumcised, left glans penis with stellate scar unchanged for years per patient, no mass, no hernia, no lesions  Rectal Deferred    Assessment and Plan :    Encounter Diagnoses  Name Primary?  . Routine general medical examination at a health care facility Yes  . Low HDL (under 40)     Physical exam - discussed healthy lifestyle, diet, exercise, preventative care, vaccinations, and addressed their concerns.   Reviewed his 03/2015 labs, EKG and PFTs he gets at work. See your dentist yearly for routine dental care including hygiene visits twice yearly. Work on losing some [employer] Work on  dietary changes as discussed for low HDL F/u yearly for physical

## 2015-10-25 NOTE — Patient Instructions (Signed)
Cholesterol  Cholesterol is a fat. Your body needs a small amount of cholesterol. Cholesterol may build up in your blood vessels. This increases your chance of having a heart attack or stroke.  You cannot feel your cholesterol levels. The only way to know your cholesterol level is high is with a blood test. Keep your test results. Work with your doctor to keep your cholesterol at a good level.  WHAT DO THE TEST RESULTS MEAN?  · Total cholesterol is how much cholesterol is in your blood.  · LDL is bad cholesterol. This is the type that can build up. You want LDL to be low.  · HDL is good cholesterol. It cleans your blood vessels and carries LDL away. You want HDL to be high.  · Triglycerides are fat that the body can burn for energy or store.  WHAT ARE GOOD LEVELS OF CHOLESTEROL?  · Total cholesterol below 200.  · LDL below 100 for people at risk. Below 70 for those at very high risk.  · HDL above 50 is good. Above 60 is best.  · Triglycerides below 150.  HOW CAN I LOWER MY CHOLESTEROL?  · Diet. Follow your diet programs as told by your doctor.    Choose fish, white meat chicken, roasted turkey, or baked turkey. Try not to eat red meat, fried foods, or processed meats such as sausage and lunch meats.    Eat lots of fresh fruits and vegetables.    Choose whole grains, beans, pasta, potatoes, and cereals.    Use only small amounts of olive, corn, or canola oils.    Try not to eat butter, mayonnaise, shortening, or palm kernel oils.    Try not to eat foods with trans fats.    Drink skim or nonfat milk. Eat low-fat or nonfat yogurt and cheeses. Try not to drink whole milk or cream. Try not to eat ice cream, egg yolks, and full-fat cheeses.    Healthy desserts include angel food cake, ginger snaps, animal crackers, hard candy, popsicles, and low-fat or nonfat frozen yogurt. Try not to eat pastries, cakes, pies, and cookies.  · Exercise. Follow your exercise programs as told by your doctor.    Be more active. You can try  gardening, walking, or taking the stairs. Ask your doctor about how you can be more active.  · Medicine. Take medicine as told by your doctor.     This information is not intended to replace advice given to you by your health care provider. Make sure you discuss any questions you have with your health care provider.     Document Released: 03/07/2009 Document Revised: 12/30/2014 Document Reviewed: 09/22/2013  Elsevier Interactive Patient Education ©2016 Elsevier Inc[employer]

## 2015-11-14 ENCOUNTER — Ambulatory Visit (INDEPENDENT_AMBULATORY_CARE_PROVIDER_SITE_OTHER): Payer: Commercial Managed Care - HMO | Admitting: Medical

## 2015-11-14 ENCOUNTER — Encounter: Payer: Self-pay | Admitting: Medical

## 2015-11-14 VITALS — BP 120/58 | HR 100 | Wt 211.0 lb

## 2015-11-14 DIAGNOSIS — K921 Melena: Secondary | ICD-10-CM

## 2015-11-14 DIAGNOSIS — K648 Other hemorrhoids: Secondary | ICD-10-CM

## 2015-11-14 LAB — POCT HEMOGLOBIN: HEMOGLOBIN: 15.4 g/dL (ref 14.1–18.1)

## 2015-11-14 NOTE — Progress Notes (Signed)
Subjective: Chief Complaint  Patient presents with  . blood in stool    about a week ago. bright stool. said its only happened once. orthostatic vitals done   Here for c/o blood in stool.  Last week when wiping one day saw bright red blood on toilet paper, some dripped in bowl as well.   Only happened once.  Has family hx/o colon cancer, so worried about this.  When the blood happened, it was a particularly harder stool to pass.   Otherwise no other prior episode of blood in stool.   Has BM once daily typically. Normally smooth and formed.  No blood mixed in with stool.  He also noted about a month ago begin constipation 4 days.   Had used some magnesium citrated during that time.   He does lift things but no straining worse than usual.  Has tried to be more mindful of health diet of late.  Denies any recent beets or dark colored foods, no recent pepto bismol, no rectal pain, no hemorrhoids.  Takes ibuprofen occasionally, but not regularly, and no other frequently NSAID use.   Not drinking alcohol.  No prior similar.  No other aggravating or relieving factors. No other complaint.  Past Medical History  Diagnosis Date  . Seasonal allergic rhinitis     spring  . RBBB     normal variant on EKG  . Asthma     childhood asthma  . Low HDL (under 40) 2015   Family History  Problem Relation Age of Onset  . Thyroid disease Mother   . Hypertension Father   . Asthma Brother   . Diabetes Maternal Aunt   . Cancer Maternal Grandmother     stomach cancer  . Diabetes Maternal Grandfather   . Cancer Paternal Grandfather     lung  . Heart disease Paternal Grandfather   . Stroke Neg Hx   . Cancer Maternal Uncle     rectal   ROS as in subjective   Objective: BP 120/58 mmHg  Pulse 100  Wt 211 lb (95.709 kg)  General appearance: alert, no distress, WD/WN Abdomen: +bs, soft, non tender, non distended, no masses, no hepatomegaly, no splenomegaly Pulses: 2+ symmetric Anus normal appearing, no  fissure or external hemorrhoids Anoscopy performed after he gave consent - 1 small internal non bloody hemorrhoid seen, but no other blood or abnormality, patient tolerated this well   Assessment: Encounter Diagnoses  Name Primary?  . Blood in stool Yes  . Internal hemorrhoid     Plan: discussed concerns, findings.   Hgb fingerstick normal today.   Sent home with stool cards x 3 to use weekly x 3 weeks.  Return these.  He has 1 single brief episode of BRBPR that is likely due to internal hemorrhoids.   Advised if he continues to see blood, or if occult cards are +, then we would go further with eval, but for now, will presume this is due to internal hemorrhoids. discussed water intake, fiber intake, and recheck if problems with hemorrhoids or recurrent issues.

## 2016-02-13 ENCOUNTER — Telehealth: Payer: Self-pay | Admitting: Medical

## 2016-02-13 LAB — PULMONARY FUNCTION TEST

## 2016-02-13 NOTE — Telephone Encounter (Signed)
Pt came in and dropped of results done at his job. I am sending back for review.

## 2016-02-14 NOTE — Telephone Encounter (Signed)
I have reviewed his labs.   His blood counts are fine, lytes, liver, kidney normal, cholesterol numbers ok, as before HDL good cholesterol could be a little better, but is improved from prior lab.  Arsenic, mercury, lead normal, lung function/PFTs normal.  Hope he is doing well, and tell him thanks for bringing these by.

## 2016-02-14 NOTE — Telephone Encounter (Signed)
Pt is aware.  

## 2016-02-15 ENCOUNTER — Encounter: Payer: Self-pay | Admitting: Medical

## 2016-08-23 ENCOUNTER — Telehealth: Payer: Self-pay

## 2016-08-23 LAB — PULMONARY FUNCTION TEST

## 2016-08-23 NOTE — Telephone Encounter (Signed)
Pt dropped off blood work and spirometry for you to review. Placed in your folder for review. Cory Barron

## 2016-08-27 NOTE — Telephone Encounter (Signed)
LMTCB. Victorino December

## 2016-08-27 NOTE — Telephone Encounter (Signed)
Spirometry was normal.  Labs only show low HDL cholesterol, but everything else tests was fine.    I assume he has 10/2016 physical scheduled?

## 2016-08-28 NOTE — Telephone Encounter (Signed)
Pt notified. Said he will have to look at his schedule and call us back to schedule CPE. Victorino December

## 2016-09-05 ENCOUNTER — Encounter: Payer: Self-pay | Admitting: Medical

## 2017-02-25 ENCOUNTER — Ambulatory Visit (INDEPENDENT_AMBULATORY_CARE_PROVIDER_SITE_OTHER): Payer: Commercial Managed Care - HMO | Admitting: Medical

## 2017-02-25 ENCOUNTER — Encounter: Payer: Self-pay | Admitting: Medical

## 2017-02-25 VITALS — BP 128/80 | HR 96 | Ht 69.5 in | Wt 205.8 lb

## 2017-02-25 DIAGNOSIS — Z8349 Family history of other endocrine, nutritional and metabolic diseases: Secondary | ICD-10-CM | POA: Insufficient documentation

## 2017-02-25 DIAGNOSIS — Z23 Encounter for immunization: Secondary | ICD-10-CM | POA: Diagnosis not present

## 2017-02-25 DIAGNOSIS — Z0000 Encounter for general adult medical examination without abnormal findings: Secondary | ICD-10-CM | POA: Diagnosis not present

## 2017-02-25 LAB — POCT URINALYSIS DIPSTICK
Bilirubin, UA: NEGATIVE
Glucose, UA: NEGATIVE
KETONES UA: NEGATIVE
Leukocytes, UA: NEGATIVE
PH UA: 7.5
PROTEIN UA: NEGATIVE
RBC UA: NEGATIVE
SPEC GRAV UA: 1.015
UROBILINOGEN UA: NEGATIVE

## 2017-02-25 NOTE — Progress Notes (Signed)
Subjective:   HPI  Cory Barron is a 31 y.o. male who presents for a complete physical.   Concerns: None, doing fine, has a 37mo son.  Eating healthier, is careful with diet, exercise.  Has lost weight since last physical but can't seem to get below current weight.  Reviewed their medical, surgical, family, social, medication, and allergy history and updated chart as appropriate.  Past Medical History:  Diagnosis Date  . Asthma    childhood asthma  . Low HDL (under 40) 2015  . RBBB    normal variant on EKG  . Seasonal allergic rhinitis    spring    Past Surgical History:  Procedure Laterality Date  . ADENOIDECTOMY      Social History   Social History  . Marital status: Single    Spouse name: N/A  . Number of children: N/A  . Years of education: N/A   Occupational History  . fireman, [employer]   Social History Main Topics  . Smoking status: Never Smoker  . Smokeless tobacco: Former Systems developer  . Alcohol use No  . Drug use: No  . Sexual activity: Not on file     Comment: fireman, married, no children, exercise - some   Other Topics Concern  . 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  Lives in [place].  Wife is nurse at [employer]  02/2017    Family History  Problem Relation Age of Onset  . Thyroid disease Mother   . Hypertension Father   . Asthma Brother   . Diabetes Maternal Aunt   . Cancer Maternal Grandmother     stomach cancer  . Diabetes Maternal Grandfather   . Cancer Paternal Grandfather     lung  . Heart disease Paternal Grandfather   . Cancer Maternal Uncle 81    rectal  . Heart disease Paternal Grandmother     CHF  . Stroke Neg Hx      Current Outpatient Prescriptions:  Marland Kitchen  Multiple Vitamins-Minerals (MULTIVITAMIN WITH MINERALS) tablet, Take 1 tablet by mouth daily.  ,  Disp: , Rfl:   No Known Allergies   Review of Systems Constitutional: -fever, -chills, -sweats, -unexpected weight change, -decreased appetite, -fatigue Allergy: -sneezing, -itching, -congestion Dermatology: -changing moles, --rash, -lumps ENT: -runny nose, -ear pain, -sore throat, -hoarseness, -sinus pain, -teeth pain, - ringing in ears, -hearing loss, -nosebleeds Cardiology: -chest pain, -palpitations, -swelling, -difficulty breathing when lying flat, -waking up short of breath Respiratory: -cough, -shortness of breath, -difficulty breathing with exercise or exertion, -wheezing, -coughing up blood Gastroenterology: -abdominal pain, -nausea, -vomiting, -diarrhea, -constipation, -blood in stool, -changes in bowel movement, -difficulty swallowing or eating Hematology: -bleeding, -bruising  Musculoskeletal: -joint aches, -muscle aches, -joint swelling, -back pain, -neck pain, -cramping, -changes in gait Ophthalmology: denies vision changes, eye redness, itching, discharge Urology: -burning with urination, -difficulty urinating, -blood in urine, -urinary frequency, -urgency, -incontinence Neurology: -headache, -weakness, -tingling, -numbness, -memory loss, -falls, -dizziness Psychology: -depressed mood, -agitation, -sleep problems     Objective:   Physical Exam  BP 128/80   Pulse 96   Ht 5' 9.5" (1.765 m)   Wt 205 lb 12.8 oz (93.4 kg)   SpO2 96%   BMI 29.96 kg/m   Wt Readings from Last 3 Encounters:  02/25/17 205 lb 12.8 oz (93.4 kg)  11/14/15 211 lb (  95.7 kg)  10/25/15 208 lb (94.3 kg)   General appearance: alert, no distress, WD/WN, white male  Skin: Left cheek just lateral to nose, with 85mm pearly papule, and smaller 26mm pink to pearly papule, scattered benign appearing macules throughout, otherwise tattoos on bilat shoulders, chest, and upper back; fireman tattoo right shoulder, man with outstretched arms/Jesus On back  HEENT: normocephalic, conjunctiva/corneas normal, sclerae  anicteric, PERRLA, EOMi, nares patent, no discharge or erythema, pharynx normal  Oral cavity: MMM, tongue normal, teeth in good repair  Neck: supple, no lymphadenopathy, no thyromegaly, no masses, normal ROM, no bruits  Chest: non tender, normal shape and expansion  Heart: RRR, normal S1, S2, no murmurs  Lungs: CTA bilaterally, no wheezes, rhonchi, or rales  Abdomen: +bs, soft, non tender, non distended, no masses, no hepatomegaly, no splenomegaly, no bruits  Back: non tender, normal ROM, no scoliosis  Musculoskeletal: upper extremities non tender, no obvious deformity, normal ROM throughout, lower extremities non tender, no obvious deformity, normal ROM throughout  Extremities: no edema, no cyanosis, no clubbing  Pulses: 2+ symmetric, upper and lower extremities, normal cap refill  Neurological: alert, oriented x 3, CN2-12 intact, strength normal upper extremities and lower extremities, sensation normal throughout, DTRs 2+ throughout, no cerebellar signs, gait normal  Psychiatric: normal affect, behavior normal, pleasant  GU: normal male external genitalia, circumcised, left glans penis with stellate scar unchanged for years per patient, no mass, no hernia, no lesions  Rectal Deferred    Assessment and Plan :    Encounter Diagnoses  Name Primary?  . Routine general medical examination at a health care facility Yes  . Need for TD vaccine   . Family history of thyroid disease     Physical exam - discussed healthy lifestyle, diet, exercise, preventative care, vaccinations, and addressed their concerns.   Advised monthly self testicular exams. Reviewed his 08/2016 labs,  PFTs he gets at work.  TSH level today for screening. See your dentist yearly for routine dental care including hygiene visits twice yearly. Work on losing some weight, discussed strategies with target heart rate, diet tracking, pedometer.   He is not interested in medications Work on dietary changes as discussed for low  HDL Counseled on the Td (tetanus, diptheria) vaccine.  Vaccine information sheet given. Td vaccine given after consent obtained. F/u yearly for physical He had flu shot 08/2016 at work.

## 2017-02-25 NOTE — Addendum Note (Signed)
Addended by: Tyrone Apple on: 02/25/2017 11:57 AM   Modules accepted: Orders

## 2017-02-25 NOTE — Addendum Note (Signed)
Addended by: Tyrone Apple on: 02/25/2017 11:53 AM   Modules accepted: Orders

## 2017-02-26 LAB — TSH: TSH: 0.69 m[IU]/L (ref 0.40–4.50)

## 2017-11-28 ENCOUNTER — Telehealth: Payer: Self-pay | Admitting: Medical

## 2017-11-28 NOTE — Telephone Encounter (Signed)
Pt dropped off labs he had them taking at work for his CPE at work he wanted to have put him his file and wanted you look over them he can be reached at [phone]

## 2017-11-28 NOTE — Telephone Encounter (Signed)
Put in your folder

## 2017-12-03 ENCOUNTER — Encounter: Payer: Self-pay | Admitting: Medical

## 2018-03-11 ENCOUNTER — Ambulatory Visit: Payer: Commercial Managed Care - HMO | Admitting: Medical

## 2018-03-11 ENCOUNTER — Encounter: Payer: Self-pay | Admitting: Medical

## 2018-03-11 VITALS — BP 124/78 | HR 86 | Temp 98.0°F | Ht 68.25 in | Wt 194.4 lb

## 2018-03-11 DIAGNOSIS — S161XXA Strain of muscle, fascia and tendon at neck level, initial encounter: Secondary | ICD-10-CM | POA: Diagnosis not present

## 2018-03-11 DIAGNOSIS — Z579 Occupational exposure to unspecified risk factor: Secondary | ICD-10-CM | POA: Diagnosis not present

## 2018-03-11 DIAGNOSIS — Z0000 Encounter for general adult medical examination without abnormal findings: Secondary | ICD-10-CM | POA: Diagnosis not present

## 2018-03-11 DIAGNOSIS — Z113 Encounter for screening for infections with a predominantly sexual mode of transmission: Secondary | ICD-10-CM

## 2018-03-11 NOTE — Progress Notes (Signed)
Subjective:   HPI  Cory Barron is a 32 y.o. male who presents for Chief Complaint  Patient presents with  . Annual Exam    no concerns     Medical care team includes: Tysinger, Camelia Eng, PA-C here for primary care Dentist Eye doctor  Concerns: Wants STD screening given potential for exposure at work.   Has crick in neck 2 wk, getting better, but not completely resolved.   Reviewed their medical, surgical, family, social, medication, and allergy history and updated chart as appropriate.  Past Medical History:  Diagnosis Date  . Asthma    childhood asthma  . Low HDL (under 40) 2015  . RBBB    normal variant on EKG  . Seasonal allergic rhinitis    spring    Past Surgical History:  Procedure Laterality Date  . ADENOIDECTOMY      Social History   Socioeconomic History  . Marital status: Single    Spouse name: Not on file  . Number of children: Not on file  . Years of education: Not on file  . Highest education level: Not on file  Social Needs  . Financial resource strain: Not on file  . Food insecurity - worry: Not on file  . Food insecurity - inability: Not on file  . Transportation needs - medical: Not on file  . Transportation needs - non-medical: Not on file  Occupational History  . Occupation: Agricultural consultant, Arts development officer: [employer]  Tobacco Use  . Smoking status: Never Smoker  . Smokeless tobacco: Former Network engineer and Sexual Activity  . Alcohol use: Yes    Alcohol/week: 0.0 oz    Comment: rarely  . Drug use: No  . Sexual activity: Not on file    Comment: fireman, married, no children, exercise - some  Other Topics Concern  . 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  Wife is nurse at [employer] [facility] [employer]  02/2018    Family History  Problem Relation Age of Onset  . Thyroid  disease Mother   . COPD Mother   . Hypertension Father   . Asthma Brother   . Diabetes Maternal Aunt   . Cancer Maternal Grandmother        stomach cancer  . Diabetes Maternal Grandfather   . Cancer Paternal Grandfather        lung  . Heart disease Paternal Grandfather   . Cancer Maternal Uncle 61       rectal  . Heart disease Paternal Grandmother        CHF  . Stroke Neg Hx      Current Outpatient Medications:  Marland Kitchen  Multiple Vitamins-Minerals (MULTIVITAMIN WITH MINERALS) tablet, Take 1 tablet by mouth daily.  , Disp: , Rfl:   No Known Allergies   Review of Systems Constitutional: -fever, -chills, -sweats, -unexpected weight change, -decreased appetite, -fatigue Allergy: -sneezing, -itching, -congestion Dermatology: -changing moles, --rash, -lumps ENT: -runny nose, -ear pain, -sore throat, -hoarseness, -sinus pain, -teeth pain, - ringing in ears, -hearing loss, -nosebleeds Cardiology: -chest pain, -palpitations, -swelling, -difficulty breathing when lying flat, -waking up short of breath Respiratory: -cough, -shortness of breath, -difficulty breathing with exercise or exertion, -wheezing, -coughing up blood Gastroenterology: -abdominal pain, -nausea, -vomiting, -diarrhea, -constipation, -blood in stool, -changes in bowel movement, -difficulty swallowing or eating Hematology: -bleeding, -  bruising  Musculoskeletal: -joint aches, -muscle aches, -joint swelling, -back pain, +neck pain, -cramping, -changes in gait Ophthalmology: denies vision changes, eye redness, itching, discharge Urology: -burning with urination, -difficulty urinating, -blood in urine, -urinary frequency, -urgency, -incontinence Neurology: -headache, -weakness, -tingling, -numbness, -memory loss, -falls, -dizziness Psychology: -depressed mood, -agitation, -sleep problems     Objective:   BP 124/78 (BP Location: Right Arm, Patient Position: Sitting, Cuff Size: Normal)   Pulse 86   Temp 98 F (36.7 C) (Oral)    Ht 5' 8.25" (1.734 m)   Wt 194 lb 6.4 oz (88.2 kg)   SpO2 97%   BMI 29.34 kg/m   General appearance: alert, no distress, WD/WN, white male  Skin: scattered benign appearing macules throughout, otherwise tattoos on bilat shoulders, chest, and upper back; fireman tattoo right shoulder, man with outstretched arms/Jesus? On back  HEENT: normocephalic, conjunctiva/corneas normal, sclerae anicteric, PERRLA, EOMi, nares patent, no discharge or erythema, pharynx normal  Oral cavity: MMM, tongue normal, teeth in good repair  Neck: supple, no lymphadenopathy, no thyromegaly, no masses, normal ROM, no bruits  Chest: non tender, normal shape and expansion  Heart: RRR, normal S1, S2, no murmurs  Lungs: CTA bilaterally, no wheezes, rhonchi, or rales  Abdomen: +bs, soft, non tender, non distended, no masses, no hepatomegaly, no splenomegaly, no bruits  Back: non tender, normal ROM, no scoliosis  Musculoskeletal: upper extremities non tender, no obvious deformity, normal ROM throughout, lower extremities non tender, no obvious deformity, normal ROM throughout  Extremities: no edema, no cyanosis, no clubbing  Pulses: 2+ symmetric, upper and lower extremities, normal cap refill  Neurological: alert, oriented x 3, CN2-12 intact, strength normal upper extremities and lower extremities, sensation normal throughout, DTRs 2+ throughout, no cerebellar signs, gait normal  Psychiatric: normal affect, behavior normal, pleasant  GU: normal male external genitalia, circumcised, left glans penis with stellate scar unchanged for years per patient, no mass, no hernia, no lesions  Rectal Deferred   Assessment and Plan :    Encounter Diagnoses  Name Primary?  . Encounter for health maintenance examination in adult Yes  . Occupational exposure in workplace   . Screen for STD (sexually transmitted disease)   . Strain of neck muscle, initial encounter     Physical exam - discussed and counseled on healthy  lifestyle, diet, exercise, preventative care, vaccinations, sick and well care, proper use of emergency dept and after hours care, and addressed their concerns.    Reviewed PFT and labs done through work from 10/2017.  Health screening: See your eye doctor yearly for routine vision care. See your dentist yearly for routine dental care including hygiene visits twice yearly.  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   Discussed prostate symptoms as well.  Prostate screening performed: No  Discussed self testicular exam  Vaccinations: Counseled on the following vaccines:  Yearly flu shot  Neck strain - c/t stretching, NSAID OTC prn short.  Declines muscle relaxer today.  He will consider massage therapy.  Call if not resolving within a week.   Nilton was seen today for annual exam.  Diagnoses and all orders for this visit:  Encounter for health maintenance examination in adult -     HIV antibody -     RPR -     GC/Chlamydia Probe Amp -     Hepatitis C antibody -  Hepatitis B surface antigen  Occupational exposure in workplace -     HIV antibody -     RPR -     GC/Chlamydia Probe Amp -     Hepatitis C antibody -     Hepatitis B surface antigen  Screen for STD (sexually transmitted disease) -     HIV antibody -     RPR -     GC/Chlamydia Probe Amp -     Hepatitis C antibody -     Hepatitis B surface antigen  Strain of neck muscle, initial encounter   Follow-up pending labs, yearly for physical

## 2018-03-12 ENCOUNTER — Encounter: Payer: Self-pay | Admitting: Medical

## 2018-03-12 LAB — GC/CHLAMYDIA PROBE AMP
Chlamydia trachomatis, NAA: NEGATIVE
NEISSERIA GONORRHOEAE BY PCR: NEGATIVE

## 2018-03-12 LAB — HEPATITIS B SURFACE ANTIGEN: HEP B S AG: NEGATIVE

## 2018-03-12 LAB — RPR: RPR Ser Ql: NONREACTIVE

## 2018-03-12 LAB — HEPATITIS C ANTIBODY

## 2018-03-12 LAB — HIV ANTIBODY (ROUTINE TESTING W REFLEX): HIV SCREEN 4TH GENERATION: NONREACTIVE

## 2018-03-13 ENCOUNTER — Telehealth: Payer: Self-pay

## 2018-03-13 NOTE — Telephone Encounter (Signed)
Called patient and advised of lab work, no questions or concerns.

## 2018-03-13 NOTE — Telephone Encounter (Signed)
-----   Message from Carlena Hurl, PA-C sent at 03/13/2018  8:14 AM EDT ----- His screen was negative or normal.  Negative for gonorrhea, chlamydia, HIV, syphilis, hepatitis B and C.

## 2018-05-20 LAB — PULMONARY FUNCTION TEST

## 2018-05-29 ENCOUNTER — Encounter: Payer: Self-pay | Admitting: Medical

## 2018-09-04 DIAGNOSIS — Z0289 Encounter for other administrative examinations: Secondary | ICD-10-CM

## 2018-11-10 ENCOUNTER — Encounter: Payer: Self-pay | Admitting: Medical

## 2018-11-10 ENCOUNTER — Ambulatory Visit: Payer: 59 | Admitting: Medical

## 2018-11-10 VITALS — BP 112/70 | HR 87 | Temp 98.1°F | Resp 16 | Ht 68.5 in | Wt 191.8 lb

## 2018-11-10 DIAGNOSIS — R058 Other specified cough: Secondary | ICD-10-CM

## 2018-11-10 DIAGNOSIS — R05 Cough: Secondary | ICD-10-CM

## 2018-11-10 MED ORDER — HYDROCODONE-HOMATROPINE 5-1.5 MG/5ML PO SYRP: 5.0000 mL | ORAL_SOLUTION | Freq: Three times a day (TID) | ORAL | 0 refills | Status: AC | PRN

## 2018-11-10 NOTE — Progress Notes (Signed)
Subjective: Chief Complaint  Patient presents with  . dry cough    dry cough X 3.5 weeks   Here for cough.  He has a 32-year-old son in daycare who is brought home colds and coughs.  He contracted the cold from his son a month ago.  Although he feels better and the illness symptoms have gone away, he has an ongoing dry cough daily nagging.  The cough is nonproductive.  He denies fever, sweats, body aches, chills, no sore throat no headache no fever.  Has been using some Mucinex and DayQuil on and off.  Non-smoker.  Otherwise normal state of health.  No other complaint.  Him and his wife are expecting their second child, a girl, due in late March 2020  Past Medical History:  Diagnosis Date  . Asthma    childhood asthma  . Low HDL (under 40) 2015  . RBBB    normal variant on EKG  . Seasonal allergic rhinitis    spring   Current Outpatient Medications on File Prior to Visit  Medication Sig Dispense Refill  . Multiple Vitamins-Minerals (MULTIVITAMIN WITH MINERALS) tablet Take 1 tablet by mouth daily.       No current facility-administered medications on file prior to visit.      Objective: BP 112/70   Pulse 87   Temp 98.1 F (36.7 C) (Oral)   Resp 16   Ht 5' 8.5" (1.74 m)   Wt 191 lb 12.8 oz (87 kg)   SpO2 96%   BMI 28.74 kg/m   Wt Readings from Last 3 Encounters:  11/10/18 191 lb 12.8 oz (87 kg)  03/11/18 194 lb 6.4 oz (88.2 kg)  02/25/17 205 lb 12.8 oz (93.4 kg)   General appearance: Alert, WD/WN, no distress, well appearing                             Skin: warm, no rash, no diaphoresis                           Head: no sinus tenderness                            Eyes: conjunctiva normal, corneas clear, PERRLA                            Ears: pearly TMs, external ear canals normal                          Nose: septum midline, turbinates swollen, with erythema and no discharge             Mouth/throat: MMM, tongue normal, no pharyngeal erythema      Neck: supple, no adenopathy, no thyromegaly, non tender                          Heart: RRR, normal S1, S2, no murmurs                         Lungs: +mild bronchial breath sounds, no rhonchi, no wheezes, no rales                Extremities: no edema, non tender       Assessment: Encounter Diagnosis  Name Primary?  . Post-viral cough syndrome Yes     Plan: His symptoms and exam suggest just post viral cough.  Advised good water intake, can use the medication below at nighttime the next several nights, and symptoms should gradually resolve.  He does have a history of childhood asthma which may be partly responsible for the inflammation of the lungs.  If not improving or much improved within the next 7 to 10 days we will pursue x-ray.  He will call back and let me know.  I congratulated him on his upcoming child in March  Callahan was seen today for dry cough.  Diagnoses and all orders for this visit:  Post-viral cough syndrome  Other orders -     HYDROcodone-homatropine (HYCODAN) 5-1.5 MG/5ML syrup; Take 5 mLs by mouth every 8 (eight) hours as needed for up to 5 days for cough.

## 2019-03-12 ENCOUNTER — Encounter: Payer: 59 | Admitting: Medical

## 2019-03-16 ENCOUNTER — Encounter: Payer: Self-pay | Admitting: Medical

## 2019-03-16 ENCOUNTER — Other Ambulatory Visit: Payer: Self-pay

## 2019-03-16 ENCOUNTER — Ambulatory Visit (INDEPENDENT_AMBULATORY_CARE_PROVIDER_SITE_OTHER): Payer: 59 | Admitting: Medical

## 2019-03-16 VITALS — BP 120/70 | HR 86 | Temp 98.2°F | Resp 16 | Ht 69.0 in | Wt 196.8 lb

## 2019-03-16 DIAGNOSIS — L659 Nonscarring hair loss, unspecified: Secondary | ICD-10-CM | POA: Diagnosis not present

## 2019-03-16 DIAGNOSIS — Z0000 Encounter for general adult medical examination without abnormal findings: Secondary | ICD-10-CM

## 2019-03-16 LAB — POCT URINALYSIS DIP (PROADVANTAGE DEVICE)
BILIRUBIN UA: NEGATIVE mg/dL
Bilirubin, UA: NEGATIVE
Glucose, UA: 100 mg/dL — AB
LEUKOCYTES UA: NEGATIVE
Nitrite, UA: NEGATIVE
PH UA: 8 (ref 5.0–8.0)
PROTEIN UA: NEGATIVE mg/dL
RBC UA: NEGATIVE
SPECIFIC GRAVITY, URINE: 1.015
Urobilinogen, Ur: NEGATIVE

## 2019-03-16 NOTE — Patient Instructions (Signed)
Recommendations:  Consider Rogaine topically over the counter OR consider calling for appt with [facility]  Get me copy of labs in May.   This past year the lipids and glucose were a little less than desirable  Consider Pescetarian or Vegetarian diet  When ready, let me know about referral to Urology about vasectomy   Lets change strategies and try something that may work better than what you are currently doing   Breakfast You may eat 1 of the following  Smoothie with Almond milk, handful of kale or spinach, and 1-2 fruit servings of your choice such as berries or 1/2 banana  Whole grain slice of toast and thin layer of low sugar jam or small amount of honey  Whole grain slice of toast and avocado spread  1/2 cup of steel cut oats (oatmeal)   Mid-morning snack 1 fruit serving such as one of the following:  medium-sized apple  medium-sized orange,  Tangerine  1/2 banana   3/4 cup of fresh berries or frozen berries  A protein source such as one of the following:  8 almonds   small handful of walnuts or other nuts  Hummus and vegetable such as carrots   Lunch A protein source such as 1 of the following: . 1 serving of beans such as black beans, pinto beans, green beans, or edamame (soy beans) . Veggie burger  . Non breaded fish such as salmon or tuna, either baked, grilled, or broiled Vegetable - Half of your plate should be a non-starchy vegetables!  So avoid white potatoes and corn.  Otherwise, eat a large portion of vegetables. . Avocado, cucumber, tomato, carrots, greens, lettuce, squash, okra, etc.  . Vegetables can include salad with olive oil/vinaigrette dressing Grains such as 1/2 cup of brown rice, quinoa, barley or other whole grain or 1 or 2 slices of whole grain bread   Mid-afternoon snack 1 fruit serving such as one of the following:  medium-sized apple  medium-sized orange,  Tangerine  1/2 banana   3/4 cup of fresh berries  or frozen berries  A protein source such as one of the following:  8 almonds   small handful of walnuts or other nuts  Hummus and vegetable such as carrots   Dinner A protein source such as 1 of the following: . 1 serving of beans such as black beans, pinto beans, green beans, or edamame (soy beans) . Veggie burger  . Non breaded fish such as salmon or tuna, either baked, grilled, or broiled Vegetable - Half of your plate should be a non-starchy vegetables!  So avoid white potatoes and corn.  Otherwise, eat a large portion of vegetables. . Avocado, cucumber, tomato, carrots, greens, lettuce, squash, okra, etc.  . Vegetables can include salad with olive oil/vinaigrette dressing Grains such as 1/2 cup of brown rice, quinoa, barley or other whole grain or 1 or 2 slices of whole grain bread   Beverages: Water Unsweet tea Home made juice with a juicer without sugar added other than small bit of honey or agave nectar Water with sugar free flavor such as [place]   AVOID.... For the time being I want you to cut out the following items completely: . Soda, sweet tea, juice, beer or wine or alcohol . Sweets such as cake, candy, pies, chips, cookies, chocolate

## 2019-03-16 NOTE — Progress Notes (Signed)
Subjective:   HPI  Cory Barron is a 33 y.o. male who presents for Chief Complaint  Patient presents with  . CPE    CPE  non fasting     Medical care team includes: Tysinger, Camelia Eng, PA-C here for primary care Dentist Eye doctor [facility] Dermatology  Concerns: Has new born on the way next week with C-section.  Reviewed their medical, surgical, family, social, medication, and allergy history and updated chart as appropriate.  Past Medical History:  Diagnosis Date  . Asthma    childhood asthma  . Low HDL (under 40) 2015  . RBBB    normal variant on EKG  . Seasonal allergic rhinitis    spring    Past Surgical History:  Procedure Laterality Date  . ADENOIDECTOMY      Social History   Socioeconomic History  . Marital status: Single    Spouse name: Not on file  . Number of children: Not on file  . Years of education: Not on file  . Highest education level: Not on file  Occupational History  . Occupation: Agricultural consultant, Arts development officer: [employer]  Social Needs  . Financial resource strain: Not on file  . Food insecurity:    Worry: Not on file    Inability: Not on file  . Transportation needs:    Medical: Not on file    Non-medical: Not on file  Tobacco Use  . Smoking status: Never Smoker  . Smokeless tobacco: Former Network engineer and Sexual Activity  . Alcohol use: Yes    Alcohol/week: 0.0 standard drinks    Comment: rarely  . Drug use: No  . Sexual activity: Not on file    Comment: fireman, married, no children, exercise - some  Lifestyle  . Physical activity:    Days per week: Not on file    Minutes per session: Not on file  . Stress: Not on file  Relationships  . Social connections:    Talks on phone: Not on file    Gets together: Not on file    Attends religious service: Not on file    Active member of club or organization: Not on file    Attends meetings of clubs or organizations: Not on file    Relationship status: Not on file   . Intimate partner violence:    Fear of current or ex partner: Not on file    Emotionally abused: Not on file    Physically abused: Not on file    Forced sexual activity: Not on file  Other Topics Concern  . 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  Lives in [place].  Wife is nurse at [employer]  02/2019    Family History  Problem Relation Age of Onset  . Thyroid disease Mother   . COPD Mother   . Hypertension Father   . Asthma Brother   . Diabetes Maternal Aunt   . Cancer Maternal Grandmother        stomach cancer  . Diabetes Maternal Grandfather   . Cancer Paternal Grandfather        lung  . Heart disease Paternal Grandfather   . Cancer Maternal Uncle 67       rectal  . Heart disease Paternal Grandmother        CHF  . Stroke Neg Hx  Current Outpatient Medications:  Marland Kitchen  Multiple Vitamins-Minerals (MULTIVITAMIN WITH MINERALS) tablet, Take 1 tablet by mouth daily.  , Disp: , Rfl:   No Known Allergies   Review of Systems Constitutional: -fever, -chills, -sweats, -unexpected weight change, -decreased appetite, -fatigue Allergy: -sneezing, -itching, -congestion Dermatology: -changing moles, --rash, -lumps ENT: -runny nose, -ear pain, -sore throat, -hoarseness, -sinus pain, -teeth pain, - ringing in ears, -hearing loss, -nosebleeds Cardiology: -chest pain, -palpitations, -swelling, -difficulty breathing when lying flat, -waking up short of breath Respiratory: -cough, -shortness of breath, -difficulty breathing with exercise or exertion, -wheezing, -coughing up blood Gastroenterology: -abdominal pain, -nausea, -vomiting, -diarrhea, -constipation, -blood in stool, -changes in bowel movement, -difficulty swallowing or eating Hematology: -bleeding, -bruising  Musculoskeletal: -joint aches, -muscle aches, -joint swelling, -back  pain, -neck pain, -cramping, -changes in gait Ophthalmology: denies vision changes, eye redness, itching, discharge Urology: -burning with urination, -difficulty urinating, -blood in urine, -urinary frequency, -urgency, -incontinence Neurology: -headache, -weakness, -tingling, -numbness, -memory loss, -falls, -dizziness Psychology: -depressed mood, -agitation, -sleep problems     Objective:   BP 120/70   Pulse 86   Temp 98.2 F (36.8 C) (Oral)   Resp 16   Ht 5\' 9"  (1.753 m)   Wt 196 lb 12.8 oz (89.3 kg)   SpO2 98%   BMI 29.06 kg/m    Wt Readings from Last 3 Encounters:  03/16/19 196 lb 12.8 oz (89.3 kg)  11/10/18 191 lb 12.8 oz (87 kg)  03/11/18 194 lb 6.4 oz (88.2 kg)    General appearance: alert, no distress, WD/WN, white male  Skin: scattered benign appearing macules throughout, otherwise tattoos on bilat shoulders, chest, and upper back; fireman tattoo right shoulder, man with outstretched arms/Jesus? On back  HEENT: normocephalic, conjunctiva/corneas normal, sclerae anicteric, PERRLA, EOMi, nares patent, no discharge or erythema, pharynx normal  Oral cavity: MMM, tongue normal, teeth in good repair  Neck: supple, no lymphadenopathy, no thyromegaly, no masses, normal ROM, no bruits  Chest: non tender, normal shape and expansion  Heart: RRR, normal S1, S2, no murmurs  Lungs: CTA bilaterally, no wheezes, rhonchi, or rales  Abdomen: +bs, soft, non tender, non distended, no masses, no hepatomegaly, no splenomegaly, no bruits  Back: non tender, normal ROM, no scoliosis  Musculoskeletal: upper extremities non tender, no obvious deformity, normal ROM throughout, lower extremities non tender, no obvious deformity, normal ROM throughout  Extremities: no edema, no cyanosis, no clubbing  Pulses: 2+ symmetric, upper and lower extremities, normal cap refill  Neurological: alert, oriented x 3, CN2-12 intact, strength normal upper extremities and lower extremities, sensation normal  throughout, DTRs 2+ throughout, no cerebellar signs, gait normal  Psychiatric: normal affect, behavior normal, pleasant  GU: normal male external genitalia, circumcised, left glans penis with stellate scar unchanged for years per patient, no mass, no hernia, no lesions  Rectal Deferred   Assessment and Plan :    Encounter Diagnoses  Name Primary?  . Routine general medical examination at a health care facility Yes  . Hair loss     Physical exam - discussed and counseled on healthy lifestyle, diet, exercise, preventative care, vaccinations, sick and well care, proper use of emergency dept and after hours care, and addressed their concerns.    Reviewed labs done through work 04/2018.  Health screening: See your eye doctor yearly for routine vision care. See your dentist yearly for routine dental care including hygiene visits twice yearly.  Cancer screening Discussed self testicular exam  Vaccinations: Counseled on the following vaccines:  Yearly flu shot  Congratulated him on his new born on the way   Deborah was seen today for cpe.  Diagnoses and all orders for this visit:  Routine general medical examination at a health care facility -     POCT Urinalysis DIP (Proadvantage Device)  Hair loss   Follow-up pending labs, yearly for physical

## 2019-11-02 ENCOUNTER — Other Ambulatory Visit: Payer: Self-pay

## 2019-11-02 DIAGNOSIS — Z20822 Contact with and (suspected) exposure to covid-19: Secondary | ICD-10-CM

## 2019-11-03 LAB — NOVEL CORONAVIRUS, NAA: SARS-CoV-2, NAA: NOT DETECTED

## 2020-01-17 ENCOUNTER — Ambulatory Visit: Payer: 59 | Attending: Internal Medicine

## 2020-01-17 DIAGNOSIS — Z20822 Contact with and (suspected) exposure to covid-19: Secondary | ICD-10-CM

## 2020-01-18 LAB — NOVEL CORONAVIRUS, NAA: SARS-CoV-2, NAA: NOT DETECTED

## 2020-07-11 ENCOUNTER — Ambulatory Visit: Payer: 59 | Admitting: Medical

## 2020-07-11 ENCOUNTER — Encounter: Payer: Self-pay | Admitting: Medical

## 2020-07-11 ENCOUNTER — Other Ambulatory Visit: Payer: Self-pay

## 2020-07-11 VITALS — BP 132/74 | HR 91 | Ht 69.0 in | Wt 209.6 lb

## 2020-07-11 DIAGNOSIS — Z7189 Other specified counseling: Secondary | ICD-10-CM

## 2020-07-11 DIAGNOSIS — Z800 Family history of malignant neoplasm of digestive organs: Secondary | ICD-10-CM | POA: Diagnosis not present

## 2020-07-11 DIAGNOSIS — Z0000 Encounter for general adult medical examination without abnormal findings: Secondary | ICD-10-CM

## 2020-07-11 DIAGNOSIS — Z569 Unspecified problems related to employment: Secondary | ICD-10-CM

## 2020-07-11 DIAGNOSIS — Z7185 Encounter for immunization safety counseling: Secondary | ICD-10-CM

## 2020-07-11 NOTE — Progress Notes (Signed)
Subjective:   HPI  [name] is a 34 y.o. male who presents for Chief Complaint  Patient presents with  . Annual Exam    not physical    Medical care team includes: Cory Barron, Cory Eng, PA-C here for primary care Dentist Eye doctor [facility] Dermatology  Concerns: Feeling well.    Has had covid vaccine.  Has 2 kids, 6yo and 58mo girl, all healthy.     Reviewed their medical, surgical, family, social, medication, and allergy history and updated chart as appropriate.  Past Medical History:  Diagnosis Date  . Asthma    childhood asthma  . Low HDL (under 40) 2015  . RBBB    normal variant on EKG  . Seasonal allergic rhinitis    spring    Past Surgical History:  Procedure Laterality Date  . ADENOIDECTOMY       Family History  Problem Relation Age of Onset  . Thyroid disease Mother   . COPD Mother   . Hypertension Father   . Asthma Brother   . Diabetes Maternal Aunt   . Cancer Maternal Grandmother        stomach cancer  . Diabetes Maternal Grandfather   . Cancer Paternal Grandfather        lung  . Heart disease Paternal Grandfather   . Cancer Maternal Uncle 35       rectal  . Heart disease Paternal Grandmother        CHF  . Stroke Neg Hx      Current Outpatient Medications:  Marland Kitchen  Multiple Vitamins-Minerals (MULTIVITAMIN WITH MINERALS) tablet, Take 1 tablet by mouth daily.   (Patient not taking: Reported on 07/11/2020), Disp: , Rfl:   No Known Allergies   Review of Systems Constitutional: -fever, -chills, -sweats, -unexpected weight change, -decreased appetite, -fatigue Allergy: -sneezing, -itching, -congestion Dermatology: -changing moles, --rash, -lumps ENT: -runny nose, -ear pain, -sore throat, -hoarseness, -sinus pain, -teeth pain, - ringing in ears, -hearing loss, -nosebleeds Cardiology: -chest pain, -palpitations, -swelling, -difficulty breathing when lying flat, -waking up short of breath Respiratory: -cough, -shortness of breath,  -difficulty breathing with exercise or exertion, -wheezing, -coughing up blood Gastroenterology: -abdominal pain, -nausea, -vomiting, -diarrhea, -constipation, -blood in stool, -changes in bowel movement, -difficulty swallowing or eating Hematology: -bleeding, -bruising  Musculoskeletal: -joint aches, -muscle aches, -joint swelling, -back pain, -neck pain, -cramping, -changes in gait Ophthalmology: denies vision changes, eye redness, itching, discharge Urology: -burning with urination, -difficulty urinating, -blood in urine, -urinary frequency, -urgency, -incontinence Neurology: -headache, -weakness, -tingling, -numbness, -memory loss, -falls, -dizziness Psychology: -depressed mood, -agitation, -sleep problems     Objective:   BP 132/74   Pulse 91   Ht 5\' 9"  (1.753 m)   Wt 209 lb 9.6 oz (95.1 kg)   SpO2 97%   BMI 30.95 kg/m     BP Readings from Last 3 Encounters:  07/11/20 132/74  03/16/19 120/70  11/10/18 112/70    Wt Readings from Last 3 Encounters:  07/11/20 209 lb 9.6 oz (95.1 kg)  03/16/19 196 lb 12.8 oz (89.3 kg)  11/10/18 191 lb 12.8 oz (87 kg)    General appearance: alert, no distress, WD/WN, white male  Skin: scattered benign appearing macules throughout, otherwise tattoos on bilat shoulders, chest, and upper back; fireman tattoo right shoulder, man with outstretched arms/Jesus on back  Neck: supple, no lymphadenopathy, no thyromegaly, no masses, normal ROM, no bruits  Chest: non tender, normal shape and expansion  Heart: RRR, normal S1, S2, no murmurs  Lungs: CTA bilaterally, no wheezes, rhonchi, or rales  Abdomen: +bs, soft, non tender, non distended, no masses, no hepatomegaly, no splenomegaly, no bruits  Back: non tender, normal ROM, no scoliosis  Musculoskeletal: upper extremities non tender, no obvious deformity, normal ROM throughout, lower extremities non tender, no obvious deformity, normal ROM throughout  Extremities: no edema, no cyanosis, no clubbing   Pulses: 2+ symmetric, upper and lower extremities, normal cap refill  Neurological: alert, oriented x 3, CN2-12 intact, strength normal upper extremities and lower extremities, sensation normal throughout, DTRs 2+ throughout, no cerebellar signs, gait normal  Psychiatric: normal affect, behavior normal, pleasant  GU: normal male external genitalia, circumcised, left glans penis with stellate scar unchanged for years per patient, no mass, no hernia, no lesions  Rectal Deferred   Assessment and Plan :    Encounter Diagnoses  Name Primary?  . Routine general medical examination at a health care facility Yes  . Adverse exposure in workplace   . Vaccine counseling   . Family history of colon cancer     Physical exam - discussed and counseled on healthy lifestyle, diet, exercise, preventative care, vaccinations, sick and well care, proper use of emergency dept and after hours care, and addressed their concerns.    We reviewed his labs from November 2020.  He gets yearly screening labs at work.  He gets spirometry and EKG every other year through work.  He gets an appointment physical on the job as well yearly.  Labs are fine.  We discussed lipids at length of the lipid panel with within normal limits  Counseled on losing weight through healthy diet and exercise, counseled on healthy lipids and reducing any junk food or unhealthy lipids  health screening: See your eye doctor yearly for routine vision care. See your dentist yearly for routine dental care including hygiene visits twice yearly. Advised f/u with dermatology for skin surveillance   Cancer screening Discussed self testicular exam  Advised first screening colon cancer screening at age 2yo given family history.   Vaccinations: Counseled on the following vaccines:  Yearly flu shot Up to date on Tetanus, Covid, hep B vaccines  Cory Barron was seen today for annual exam.  Diagnoses and all orders for this visit:  Routine  general medical examination at a health care facility  Adverse exposure in workplace  Vaccine counseling  Family history of colon cancer     Follow-up pending labs, yearly for physical

## 2020-07-18 ENCOUNTER — Encounter: Payer: Self-pay | Admitting: Medical

## 2020-08-02 ENCOUNTER — Other Ambulatory Visit: Payer: Self-pay

## 2020-08-02 ENCOUNTER — Encounter: Payer: Self-pay | Admitting: Family Medicine

## 2020-08-02 ENCOUNTER — Ambulatory Visit: Payer: 59 | Admitting: Family Medicine

## 2020-08-02 VITALS — BP 138/88 | HR 81 | Temp 97.4°F | Wt 207.8 lb

## 2020-08-02 DIAGNOSIS — H60503 Unspecified acute noninfective otitis externa, bilateral: Secondary | ICD-10-CM

## 2020-08-02 MED ORDER — NEOMYCIN-POLYMYXIN-HC 3.5-10000-1 OT SUSP: 2.0000 [drp] | Freq: Four times a day (QID) | OTIC | 0 refills | Status: DC

## 2020-08-02 NOTE — Progress Notes (Signed)
   Subjective:    Patient ID: Cory Barron, male    DOB: 30-Nov-1986, 34 y.o.   MRN: [id number]  HPI He states that he has been using drugs recently and after use he has noted some swelling and drainage from both ears.  He held off on using on for a month and started again and noted again the same problem.   Review of Systems     Objective:   Physical Exam Exam of both ears does show some slight erythema at the entrance of the ear canal well over no swelling is noted inside the canal.  Both TMs are normal.       Assessment & Plan:  Acute otitis externa of both ears, unspecified type - Plan: neomycin-polymyxin-hydrocortisone (CORTISPORIN) 3.5-10000-1 OTIC suspension Am going to treat this like an otitis externa however this could be an eczema issue.  If he continues have trouble will refer to ENT for further evaluation.

## 2020-08-25 ENCOUNTER — Telehealth: Payer: 59 | Admitting: Medical

## 2020-08-25 ENCOUNTER — Other Ambulatory Visit: Payer: Self-pay

## 2020-08-25 VITALS — Temp 98.6°F | Wt 200.0 lb

## 2020-08-25 DIAGNOSIS — Z20818 Contact with and (suspected) exposure to other bacterial communicable diseases: Secondary | ICD-10-CM

## 2020-08-25 DIAGNOSIS — R112 Nausea with vomiting, unspecified: Secondary | ICD-10-CM

## 2020-08-25 LAB — POCT INFLUENZA A/B
Influenza A, POC: NEGATIVE
Influenza B, POC: NEGATIVE

## 2020-08-25 LAB — POC COVID19 BINAXNOW: SARS Coronavirus 2 Ag: NEGATIVE

## 2020-08-25 LAB — POCT RAPID STREP A (OFFICE): Rapid Strep A Screen: NEGATIVE

## 2020-08-25 MED ORDER — AMOXICILLIN 500 MG PO CAPS
500.0000 mg | ORAL_CAPSULE | Freq: Three times a day (TID) | ORAL | 0 refills | Status: DC
Start: 2020-08-25 — End: 2021-11-08

## 2020-08-25 MED ORDER — EMERGEN-C IMMUNE PLUS PO PACK
1.0000 | PACK | Freq: Two times a day (BID) | ORAL | 0 refills | Status: DC
Start: 2020-08-25 — End: 2021-11-08

## 2020-08-25 MED ORDER — ONDANSETRON 4 MG PO TBDP
4.0000 mg | ORAL_TABLET | Freq: Three times a day (TID) | ORAL | 0 refills | Status: DC | PRN
Start: 2020-08-25 — End: 2021-11-08

## 2020-08-25 NOTE — Addendum Note (Signed)
Addended by: Edgar Frisk on: 08/25/2020 01:01 PM   Modules accepted: Orders

## 2020-08-25 NOTE — Progress Notes (Signed)
Subjective:     Patient ID: Cory Barron, male   DOB: [birth year], 34 y.o.   MRN: [id number]  This visit type was conducted due to national recommendations for restrictions regarding the COVID-19 Pandemic (e.g. social distancing) in an effort to limit this patient's exposure and mitigate transmission in our community.  Due to their co-morbid illnesses, this patient is at least at moderate risk for complications without adequate follow up.  This format is felt to be most appropriate for this patient at this time.    Documentation for virtual audio and video telecommunications through [place] encounter:  The patient was located at home. The provider was located in the office. The patient did consent to this visit and is aware of possible charges through their insurance for this visit.  The other persons participating in this telemedicine service were none. Time spent on call was 20 minutes and in review of previous records 20 minutes total.  This virtual service is not related to other E/M service within previous 7 days.   HPI Chief Complaint  Patient presents with  . strep    nausea, vomitting started last night. kids just tested postive for strep tuesday and thursday and that was there only symptoms   Virtual consult.  Only has nausea and vomiting x 1 day.  Vomited once, but nausea all last night.  Denies sore throat, runny nose, no cough, no body aches, no chills.  Stool was a little looser than usual.  He was exposed by his children.   His son had strep a month ago, so their pediatrician focused on strep swabbing.  Kids didn't get covid tested this week with symptoms.  The pediatrician is treating his children for strep +.   His son had covid test a month ago that was negative.   Jahson has had covid vaccine.  No other sick contacts.  No other aggravating or relieving factors. No other complaint.   Review of Systems As in subjective    Objective:   Physical Exam Due to  coronavirus pandemic stay at home measures, patient visit was virtual and they were not examined in person.   Temp 98.6 F (37 C)   Wt 200 lb (90.7 kg)   BMI 29.53 kg/m       Assessment:     Encounter Diagnoses  Name Primary?  . Strep throat exposure Yes  . Nausea and vomiting, intractability of vomiting not specified, unspecified vomiting type        Plan:     We discussed symptoms, concerns.  We discussed that we can't completely rule out covid. He will come to the office today for covid test.    We will empirically treat for strep though as below.  Discussed supportive care, rest, hydrate well, salt water gargles, warm fluids, Tylenol OTC for pain, fever, begin antibiotic below, Zofran for nausea, medicaiton as below.   If worse over the next 2-3 days, call back or go to ED if much worse  Bracken was seen today for strep.  Diagnoses and all orders for this visit:  Strep throat exposure  Nausea and vomiting, intractability of vomiting not specified, unspecified vomiting type  Other orders -     amoxicillin (AMOXIL) 500 MG capsule; Take 1 capsule (500 mg total) by mouth 3 (three) times daily. -     ondansetron (ZOFRAN ODT) 4 MG disintegrating tablet; Take 1 tablet (4 mg total) by mouth every 8 (eight) hours as needed for nausea or vomiting. -  Multiple Vitamins-Minerals (EMERGEN-C IMMUNE PLUS) PACK; Take 1 tablet by mouth 2 (two) times daily.  f/u with nurse visit outside for covid test at 12pm today.

## 2020-08-25 NOTE — Progress Notes (Signed)
Done

## 2020-08-27 LAB — NOVEL CORONAVIRUS, NAA: SARS-CoV-2, NAA: NOT DETECTED

## 2020-10-02 ENCOUNTER — Other Ambulatory Visit: Payer: 59

## 2020-10-02 DIAGNOSIS — Z20822 Contact with and (suspected) exposure to covid-19: Secondary | ICD-10-CM

## 2020-10-03 LAB — SARS-COV-2, NAA 2 DAY TAT

## 2020-10-03 LAB — NOVEL CORONAVIRUS, NAA: SARS-CoV-2, NAA: NOT DETECTED

## 2020-10-19 ENCOUNTER — Ambulatory Visit (INDEPENDENT_AMBULATORY_CARE_PROVIDER_SITE_OTHER): Payer: 59

## 2020-10-19 ENCOUNTER — Other Ambulatory Visit: Payer: Self-pay

## 2020-10-19 DIAGNOSIS — Z23 Encounter for immunization: Secondary | ICD-10-CM

## 2020-12-18 ENCOUNTER — Other Ambulatory Visit: Payer: 59

## 2020-12-18 DIAGNOSIS — Z20822 Contact with and (suspected) exposure to covid-19: Secondary | ICD-10-CM

## 2020-12-19 LAB — NOVEL CORONAVIRUS, NAA: SARS-CoV-2, NAA: NOT DETECTED

## 2020-12-19 LAB — SARS-COV-2, NAA 2 DAY TAT

## 2021-07-12 ENCOUNTER — Encounter: Payer: 59 | Admitting: Medical

## 2021-07-12 ENCOUNTER — Other Ambulatory Visit: Payer: Self-pay

## 2021-07-12 ENCOUNTER — Telehealth: Payer: 59 | Admitting: Medical

## 2021-07-12 VITALS — Wt 200.0 lb

## 2021-07-12 DIAGNOSIS — J988 Other specified respiratory disorders: Secondary | ICD-10-CM | POA: Diagnosis not present

## 2021-07-12 NOTE — Progress Notes (Signed)
Subjective:     Patient ID: Cory Barron, male   DOB: November 28, 1986, 35 y.o.   MRN: [id number]  This visit type was conducted due to national recommendations for restrictions regarding the COVID-19 Pandemic (e.g. social distancing) in an effort to limit this patient's exposure and mitigate transmission in our community.  Due to their co-morbid illnesses, this patient is at least at moderate risk for complications without adequate follow up.  This format is felt to be most appropriate for this patient at this time.    Documentation for virtual audio and video telecommunications through [place] encounter:  The patient was located at home. The provider was located in the office. The patient did consent to this visit and is aware of possible charges through their insurance for this visit.  The other persons participating in this telemedicine service were none. Time spent on call was 20 minutes and in review of previous records 20 minutes total.  This virtual service is not related to other E/M service within previous 7 days.   HPI Chief Complaint  Patient presents with   other    Covid symptoms ST, drainage, runny nose, started 3 days ago, negative covid yesterday    Virtual consult for cold symptoms.  He noticed having symptoms starting 3 days ago.  He notes he has had 3 days of crud.  His daughter has been sick with a head cold as well so he thinks he picked it up from her.  He also has had some family members recently [place] that he may have been around  He did a home COVID test yesterday that was negative.  His symptoms include sore throat, congestion, drainage, headache mild, some cough.  No fever, no NVD, no sob, no loss smell or taste, no body aches or chills, he is using nothing for his symptoms  He does not return to work until 4 days from now  No other aggravating or relieving factors. No other complaint.  Past Medical History:  Diagnosis Date   Asthma    childhood asthma    Low HDL (under 40) 2015   RBBB    normal variant on EKG   Seasonal allergic rhinitis    spring     Review of Systems As in subjective    Objective:   Physical Exam Due to coronavirus pandemic stay at home measures, patient visit was virtual and they were not examined in person.   Wt 200 lb (90.7 kg)   BMI 29.53 kg/m   Gen: wd, wn, nad, white male, no significant ill-appearing No shortness of breath, no labored breathing     Assessment:     Encounter Diagnosis  Name Primary?   Respiratory tract infection Yes       Plan:     We discussed his current symptoms suggest viral respiratory tract infection that he seems to have a mild case of.  We discussed supportive care including rest, hydration, Robitussin-DM, Tylenol, salt water gargles and similar over-the-counter remedies  If worsening in the coming days certainly he can call back.  We discussed possibly coming in for COVID PCR test.  He will still think about that.  He does not return to work for 4 more days so technically if his symptoms are resolved by that time he would have completed his quarantine anyhow.  We discussed wearing a mask for 5 additional days after quarantine if he were to test positive in the meantime  He has underlying asthma but no current  shortness of breath or wheezing.    Clevester was seen today for other.  Diagnoses and all orders for this visit:  Respiratory tract infection  Recheck if worse or not improved

## 2021-10-25 ENCOUNTER — Encounter: Payer: 59 | Admitting: Medical

## 2021-11-08 ENCOUNTER — Encounter: Payer: Self-pay | Admitting: Medical

## 2021-11-08 ENCOUNTER — Other Ambulatory Visit: Payer: Self-pay

## 2021-11-08 ENCOUNTER — Ambulatory Visit (INDEPENDENT_AMBULATORY_CARE_PROVIDER_SITE_OTHER): Payer: 59 | Admitting: Medical

## 2021-11-08 VITALS — BP 118/88 | HR 100 | Ht 68.0 in | Wt 203.4 lb

## 2021-11-08 DIAGNOSIS — I4510 Unspecified right bundle-branch block: Secondary | ICD-10-CM

## 2021-11-08 DIAGNOSIS — E786 Lipoprotein deficiency: Secondary | ICD-10-CM

## 2021-11-08 DIAGNOSIS — Z13220 Encounter for screening for lipoid disorders: Secondary | ICD-10-CM

## 2021-11-08 DIAGNOSIS — J4520 Mild intermittent asthma, uncomplicated: Secondary | ICD-10-CM

## 2021-11-08 DIAGNOSIS — Z23 Encounter for immunization: Secondary | ICD-10-CM

## 2021-11-08 DIAGNOSIS — Z0000 Encounter for general adult medical examination without abnormal findings: Secondary | ICD-10-CM | POA: Diagnosis not present

## 2021-11-08 DIAGNOSIS — Z8349 Family history of other endocrine, nutritional and metabolic diseases: Secondary | ICD-10-CM | POA: Diagnosis not present

## 2021-11-08 DIAGNOSIS — Z7185 Encounter for immunization safety counseling: Secondary | ICD-10-CM | POA: Diagnosis not present

## 2021-11-08 DIAGNOSIS — Z800 Family history of malignant neoplasm of digestive organs: Secondary | ICD-10-CM

## 2021-11-08 DIAGNOSIS — Z136 Encounter for screening for cardiovascular disorders: Secondary | ICD-10-CM | POA: Diagnosis not present

## 2021-11-08 DIAGNOSIS — M67479 Ganglion, unspecified ankle and foot: Secondary | ICD-10-CM

## 2021-11-08 DIAGNOSIS — Z6830 Body mass index (BMI) 30.0-30.9, adult: Secondary | ICD-10-CM

## 2021-11-08 DIAGNOSIS — Z569 Unspecified problems related to employment: Secondary | ICD-10-CM

## 2021-11-08 NOTE — Progress Notes (Signed)
Subjective:   HPI  Cory Barron is a 35 y.o. male who presents for Chief Complaint  Patient presents with   Annual Exam    Patient Care Team: Jasmarie Coppock, Leward Quan as PCP - General Sees dentist Sees eye doctor  Concerns: Here for physical.  Doing fine.  No particular complaints  He changed jobs in the last year.  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  He spends at least 8 days/month in [place].  So he does not necessarily in the field as much now.  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  He started with the protocol when he was 35 years old.  He has a lump on his right foot he wants looked at.  Otherwise normal state of health  Reviewed their medical, surgical, family, social, medication, and allergy history and updated chart as appropriate.  Past Medical History:  Diagnosis Date   Asthma    childhood asthma   Low HDL (under 40) 2015   RBBB    normal variant on EKG   Seasonal allergic rhinitis    spring    Past Surgical History:  Procedure Laterality Date   ADENOIDECTOMY      Family History  Problem Relation Age of Onset   Thyroid disease Mother    COPD Mother    Hypertension Father    Asthma Brother    Diabetes Maternal Aunt    Cancer Maternal Uncle 45       rectal   Cancer Maternal Grandmother        colorectal cancer   Diabetes Maternal Grandfather    Heart disease Paternal Grandmother        CHF   Cancer Paternal Grandfather        lung   Heart disease Paternal Grandfather    Stroke Neg Hx      Current Outpatient Medications:    finasteride (PROPECIA) 1 MG tablet, Take 1 tablet by mouth daily., Disp: , Rfl:   No Known Allergies   Review of Systems Constitutional: -fever, -chills, -sweats, -unexpected weight change, -decreased appetite,  -fatigue Allergy: -sneezing, -itching, -congestion Dermatology: -changing moles, --rash, -lumps ENT: -runny nose, -ear pain, -sore throat, -hoarseness, -sinus pain, -teeth pain, - ringing in ears, -hearing loss, -nosebleeds Cardiology: -chest pain, -palpitations, -swelling, -difficulty breathing when lying flat, -waking up short of breath Respiratory: -cough, -shortness of breath, -difficulty breathing with exercise or exertion, -wheezing, -coughing up blood Gastroenterology: -abdominal pain, -nausea, -vomiting, -diarrhea, -constipation, -blood in stool, -changes in bowel movement, -difficulty swallowing or eating Hematology: -bleeding, -bruising  Musculoskeletal: -joint aches, -muscle aches, -joint swelling, -back pain, -neck pain, -cramping, -changes in gait Ophthalmology: denies vision changes, eye redness, itching, discharge Urology: -burning with urination, -difficulty urinating, -blood in urine, -urinary frequency, -urgency, -incontinence Neurology: -headache, -weakness, -tingling, -numbness, -memory loss, -falls, -dizziness Psychology: -depressed mood, -agitation, -sleep problems Male GU: no testicular mass, pain, no lymph nodes swollen, no swelling, no rash.  Depression screen [facility] 2/9 11/08/2021 07/11/2020 03/16/2019 03/11/2018 10/25/2015  Decreased Interest 0 0 0 0 0  Down, Depressed, Hopeless 0 0 0 0 0  PHQ - 2 Score 0 0 0 0 0        Objective:  BP 118/88 (BP Location: Right Arm, Patient Position: Sitting)   Pulse 100   Ht 5\' 8"  (  1.727 m)   Wt 203 lb 6.4 oz (92.3 kg)   SpO2 96%   BMI 30.93 kg/m   General appearance: alert, no distress, WD/WN, Caucasian male Skin: scattered macules, no worrisome lesions HEENT: normocephalic, conjunctiva/corneas normal, sclerae anicteric, PERRLA, EOMi Neck: supple, no lymphadenopathy, no thyromegaly, no masses, normal ROM, no bruits Chest: non tender, normal shape and expansion Heart: RRR, normal S1, S2, no murmurs Lungs: CTA bilaterally, no  wheezes, rhonchi, or rales Abdomen: +bs, soft, non tender, non distended, no masses, no hepatomegaly, no splenomegaly, no bruits Back: non tender, normal ROM, no scoliosis Musculoskeletal: Right foot midfoot between fourth and fifth toes dorsally with a small mobile round 5 mm cystic lesion, benign, no induration fluctuance or warmth.  Upper extremities non tender, no obvious deformity, normal ROM throughout, lower extremities non tender, no obvious deformity, normal ROM throughout Extremities: no edema, no cyanosis, no clubbing Pulses: 2+ symmetric, upper and lower extremities, normal cap refill Neurological: alert, oriented x 3, CN2-12 intact, strength normal upper extremities and lower extremities, sensation normal throughout, DTRs 2+ throughout, no cerebellar signs, gait normal Psychiatric: normal affect, behavior normal, pleasant  GU: normal male external genitalia,circumcised, nontender, no masses, no hernia, no lymphadenopathy Rectal: deferrred   EKG Indication physical, screen for heart disease, workplace exposure, rate 85 bpm, PR 148 ms, QRS 106 ms, QTC 433 ms, axis 67 degrees, incomplete right bundle branch block, no acute change   Assessment and Plan :   Encounter Diagnoses  Name Primary?   Routine general medical examination at a health care facility Yes   Vaccine counseling    Low HDL (under 40)    Family history of thyroid disease    Family history of colon cancer    Adverse exposure in workplace    Mild intermittent asthma, unspecified whether complicated    BMI 70.9-62.8,ZMOQH    Screening for heart disease    Screening for lipid disorders    Needs flu shot    Need for COVID-19 vaccine    RBBB    Ganglion cyst of foot     This visit was a preventative care visit, also known as wellness visit or routine physical.   Topics typically include healthy lifestyle, diet, exercise, preventative care, vaccinations, sick and well care, proper use of emergency dept and after  hours care, as well as other concerns.     Recommendations: Continue to return yearly for your annual wellness and preventative care visits.  This gives [place] a chance to discuss healthy lifestyle, exercise, vaccinations, review your chart record, and perform screenings where appropriate.  I recommend you see your eye doctor yearly for routine vision care.  I recommend you see your dentist yearly for routine dental care including hygiene visits twice yearly.   Vaccination recommendations were reviewed Immunization History  Administered Date(s) Administered   Hepatitis B 10/14/2012, 11/11/2012, 03/08/2013   Influenza Split 10/07/2015   Influenza,inj,Quad PF,6+ Mos 10/19/2020, 11/08/2021   Influenza-Unspecified 10/07/2014, 09/22/2018, 08/24/2019   Moderna Covid-19 Vaccine Bivalent Booster 64yrs & up 11/08/2021   Moderna Sars-Covid-2 Vaccination 12/27/2019, 01/29/2020, 10/19/2020   Td 02/25/2017   Tdap 09/03/2007    Counseled on the influenza virus vaccine.  Vaccine information sheet given.  Influenza vaccine given after consent obtained.  Counseled on the Covid virus vaccine.  Vaccine information sheet given.  Covid vaccine given after consent obtained.    Screening for cancer: Colon cancer screening: We will refer you for screening colonoscopy given family history  Testicular cancer screening  You should do a monthly self testicular exam if you are between 65-96 years old  We discussed PSA, prostate exam, and prostate cancer screening risks/benefits.   Age 25  Skin cancer screening: Check your skin regularly for new changes, growing lesions, or other lesions of concern Come in for evaluation if you have skin lesions of concern.  Lung cancer screening: If you have a greater than 20 pack year history of tobacco use, then you may qualify for lung cancer screening with a chest CT scan.   Please call your insurance company to inquire about coverage for this test.  We currently  don't have screenings for other cancers besides breast, cervical, colon, and lung cancers.  If you have a strong family history of cancer or have other cancer screening concerns, please let me know.    Bone health: Get at least 150 minutes of aerobic exercise weekly Get weight bearing exercise at least once weekly Bone density test:  A bone density test is an imaging test that uses a type of X-ray to measure the amount of calcium and other minerals in your bones. The test may be used to diagnose or screen you for a condition that causes weak or thin bones (osteoporosis), predict your risk for a broken bone (fracture), or determine how well your osteoporosis treatment is working. The bone density test is recommended for females 66 and older, or females or males <62 if certain risk factors such as thyroid disease, long term use of steroids such as for asthma or rheumatological issues, vitamin D deficiency, estrogen deficiency, family history of osteoporosis, self or family history of fragility fracture in first degree relative.    Heart health: Get at least 150 minutes of aerobic exercise weekly Limit alcohol It is important to maintain a healthy blood pressure and healthy cholesterol numbers  Heart disease screening: Screening for heart disease includes screening for blood pressure, fasting lipids, glucose/diabetes screening, BMI height to weight ratio, reviewed of smoking status, physical activity, and diet.    Goals include blood pressure 120/80 or less, maintaining a healthy lipid/cholesterol profile, preventing diabetes or keeping diabetes numbers under good control, not smoking or using tobacco products, exercising most days per week or at least 150 minutes per week of exercise, and eating healthy variety of fruits and vegetables, healthy oils, and avoiding unhealthy food choices like fried food, fast food, high sugar and high cholesterol foods.    Other tests may possibly include EKG  test, CT coronary calcium score, echocardiogram, exercise treadmill stress test.   Referral for CT coronary score.    Medical care options: I recommend you continue to seek care here first for routine care.  We try really hard to have available appointments Monday through Friday daytime hours for sick visits, acute visits, and physicals.  Urgent care should be used for after hours and weekends for significant issues that cannot wait till the next day.  The emergency department should be used for significant potentially life-threatening emergencies.  The emergency department is expensive, can often have long wait times for less significant concerns, so try to utilize primary care, urgent care, or telemedicine when possible to avoid unnecessary trips to the emergency department.  Virtual visits and telemedicine have been introduced since the pandemic started in 2020, and can be convenient ways to receive medical care.  We offer virtual appointments as well to assist you in a variety of options to seek medical care.    Separate significant issues discussed: Encounter Diagnoses  Name Primary?   Routine general medical examination at a health care facility Yes   Vaccine counseling    Low HDL (under 40)    Family history of thyroid disease    Family history of colon cancer    Adverse exposure in workplace    Mild intermittent asthma, unspecified whether complicated    BMI 03.4-91.7,HXTAV    Screening for heart disease    Screening for lipid disorders    Needs flu shot    Need for COVID-19 vaccine    RBBB    Ganglion cyst of foot    Reassured, cyst such as ganglion cyst.  Discussed that this may stay unchanged may go away or may get bigger.  For now, leave it alone.  Routine screening labs today  Asthma-no recent problems.  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  We discussed possibly doing EKG every couple years or every other year.  We discussed PFT yearly or  every other yearly.  We will try to continue doing a panel of labs.  We discussed that there is no one comprehensive lab panel of blood work for screening for cancers in general so we have to be vigilant about routine screenings  RBBB - unchanged, reviewed prior EKG   Cory Barron was seen today for annual exam.  Diagnoses and all orders for this visit:  Routine general medical examination at a health care facility -     Comprehensive metabolic panel -     CBC with Differential/Platelet -     Lipid panel -     TSH -     EKG 12-Lead -     Urinalysis, Routine w reflex microscopic -     Ambulatory referral to Gastroenterology -     CT CARDIAC SCORING ([facility] LOCATIONS ONLY); Future  Vaccine counseling  Low HDL (under 40)  Family history of thyroid disease -     TSH  Family history of colon cancer -     Ambulatory referral to Gastroenterology  Adverse exposure in workplace -     Comprehensive metabolic panel -     CBC with Differential/Platelet -     Urinalysis, Routine w reflex microscopic  Mild intermittent asthma, unspecified whether complicated  BMI 69.7-94.8,AXKPV -     TSH  Screening for heart disease -     EKG 12-Lead -     CT CARDIAC SCORING ([facility] LOCATIONS ONLY); Future  Screening for lipid disorders -     Lipid panel  Needs flu shot -     Flu Vaccine QUAD 54mo+IM (Fluarix, Fluzone & Alfiuria Quad PF)  Need for COVID-19 vaccine -     Moderna Covid-19 Vaccine Bivalent Booster  RBBB  Ganglion cyst of foot   Follow-up pending labs, yearly for physical

## 2021-11-09 ENCOUNTER — Other Ambulatory Visit: Payer: Self-pay | Admitting: Medical

## 2021-11-09 DIAGNOSIS — M67479 Ganglion, unspecified ankle and foot: Secondary | ICD-10-CM | POA: Insufficient documentation

## 2021-11-09 LAB — CBC WITH DIFFERENTIAL/PLATELET
Basophils Absolute: 0.1 10*3/uL (ref 0.0–0.2)
Basos: 1 %
EOS (ABSOLUTE): 0.1 10*3/uL (ref 0.0–0.4)
Eos: 1 %
Hematocrit: 48.3 % (ref 37.5–51.0)
Hemoglobin: 17.4 g/dL (ref 13.0–17.7)
Immature Grans (Abs): 0.1 10*3/uL (ref 0.0–0.1)
Immature Granulocytes: 1 %
Lymphocytes Absolute: 2.7 10*3/uL (ref 0.7–3.1)
Lymphs: 23 %
MCH: 31 pg (ref 26.6–33.0)
MCHC: 36 g/dL — ABNORMAL HIGH (ref 31.5–35.7)
MCV: 86 fL (ref 79–97)
Monocytes Absolute: 0.9 10*3/uL (ref 0.1–0.9)
Monocytes: 8 %
Neutrophils Absolute: 8 10*3/uL — ABNORMAL HIGH (ref 1.4–7.0)
Neutrophils: 66 %
Platelets: 267 10*3/uL (ref 150–450)
RBC: 5.62 x10E6/uL (ref 4.14–5.80)
RDW: 12.2 % (ref 11.6–15.4)
WBC: 11.8 10*3/uL — ABNORMAL HIGH (ref 3.4–10.8)

## 2021-11-09 LAB — COMPREHENSIVE METABOLIC PANEL
ALT: 32 IU/L (ref 0–44)
AST: 22 IU/L (ref 0–40)
Albumin/Globulin Ratio: 2.3 — ABNORMAL HIGH (ref 1.2–2.2)
Albumin: 5.2 g/dL — ABNORMAL HIGH (ref 4.0–5.0)
Alkaline Phosphatase: 86 IU/L (ref 44–121)
BUN/Creatinine Ratio: 10 (ref 9–20)
BUN: 10 mg/dL (ref 6–20)
Bilirubin Total: 0.8 mg/dL (ref 0.0–1.2)
CO2: 24 mmol/L (ref 20–29)
Calcium: 9.6 mg/dL (ref 8.7–10.2)
Chloride: 101 mmol/L (ref 96–106)
Creatinine, Ser: 1.04 mg/dL (ref 0.76–1.27)
Globulin, Total: 2.3 g/dL (ref 1.5–4.5)
Glucose: 88 mg/dL (ref 70–99)
Potassium: 4.4 mmol/L (ref 3.5–5.2)
Sodium: 140 mmol/L (ref 134–144)
Total Protein: 7.5 g/dL (ref 6.0–8.5)
eGFR: 96 mL/min/{1.73_m2} (ref >59)

## 2021-11-09 LAB — LIPID PANEL
Chol/HDL Ratio: 4.6 ratio (ref 0.0–5.0)
Cholesterol, Total: 223 mg/dL — ABNORMAL HIGH (ref 100–199)
HDL: 49 mg/dL (ref >39)
LDL Chol Calc (NIH): 153 mg/dL — ABNORMAL HIGH (ref 0–99)
Triglycerides: 119 mg/dL (ref 0–149)
VLDL Cholesterol Cal: 21 mg/dL (ref 5–40)

## 2021-11-09 LAB — URINALYSIS, ROUTINE W REFLEX MICROSCOPIC
Bilirubin, UA: NEGATIVE
Glucose, UA: NEGATIVE
Ketones, UA: NEGATIVE
Leukocytes,UA: NEGATIVE
Nitrite, UA: NEGATIVE
Protein,UA: NEGATIVE
RBC, UA: NEGATIVE
Specific Gravity, UA: 1.017 (ref 1.005–1.030)
Urobilinogen, Ur: 0.2 mg/dL (ref 0.2–1.0)
pH, UA: 6.5 (ref 5.0–7.5)

## 2021-11-09 LAB — TSH: TSH: 0.823 u[IU]/mL (ref 0.450–4.500)

## 2021-12-10 ENCOUNTER — Encounter: Payer: Self-pay | Admitting: Gastroenterology

## 2021-12-10 ENCOUNTER — Ambulatory Visit
Admission: RE | Admit: 2021-12-10 | Discharge: 2021-12-10 | Disposition: A | Discharge status: Home / Self Care | Payer: No Typology Code available for payment source | Source: Ambulatory Visit | Attending: Medical | Admitting: Medical

## 2021-12-10 ENCOUNTER — Ambulatory Visit (INDEPENDENT_AMBULATORY_CARE_PROVIDER_SITE_OTHER): Payer: 59 | Admitting: Gastroenterology

## 2021-12-10 VITALS — BP 120/80 | HR 92 | Ht 69.0 in | Wt 209.0 lb

## 2021-12-10 DIAGNOSIS — K921 Melena: Secondary | ICD-10-CM | POA: Diagnosis not present

## 2021-12-10 DIAGNOSIS — Z0000 Encounter for general adult medical examination without abnormal findings: Secondary | ICD-10-CM

## 2021-12-10 DIAGNOSIS — Z136 Encounter for screening for cardiovascular disorders: Secondary | ICD-10-CM

## 2021-12-10 DIAGNOSIS — Z800 Family history of malignant neoplasm of digestive organs: Secondary | ICD-10-CM | POA: Diagnosis not present

## 2021-12-10 MED ORDER — SUTAB 1479-225-188 MG PO TABS: 24.0000 | ORAL_TABLET | Freq: Once | ORAL | 0 refills | Status: AC

## 2021-12-10 NOTE — Patient Instructions (Signed)
If you are age 35 or older, your body mass index should be between 23-30. Your Body mass index is 30.86 kg/m. If this is out of the aforementioned range listed, please consider follow up with your Primary Care Provider.  If you are age 57 or younger, your body mass index should be between 19-25. Your Body mass index is 30.86 kg/m. If this is out of the aformentioned range listed, please consider follow up with your Primary Care Provider.   ________________________________________________________  The [facility] providers would like to encourage you to use [facility] to communicate with providers for non-urgent requests or questions.  Due to long hold times on the telephone, sending your provider a message by [facility] may be a faster and more efficient way to get a response.  Please allow 48 business hours for a response.  Please remember that this is for non-urgent requests.   You have been scheduled for a colonoscopy. Please follow written instructions given to you at your visit today.  Please pick up your prep supplies at the pharmacy within the next 1-3 days. If you use inhalers (even only as needed), please bring them with you on the day of your procedure.  It was a pleasure to see you today!  Thank you for trusting me with your gastrointestinal care!    Daryel November, MD

## 2021-12-10 NOTE — Progress Notes (Signed)
HPI : Cory Barron is a very pleasant 35 year old male with no significant medical problems who is referred to [place] by Chana Bode, PA for consider of colonoscopy.  The patient has two family members who were diagnosed with colon cancer (maternal uncle in his 49s and maternal grandmother in her 66s).  His mother had polyps (unknown details).  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  He denies any chronic GI symptoms, such as abdominal pain, constipation or diarrhea.  He has seen scant blood on the toilet paper on occasion.  No pain with passage of stool. He denies any chronic upper GI symptoms such as heartburn/acid regurgitation, dysphagia, dyspepsia.  Past Medical History:  Diagnosis Date   Asthma    childhood asthma   Low HDL (under 40) 2015   RBBB    normal variant on EKG   Seasonal allergic rhinitis    spring     Past Surgical History:  Procedure Laterality Date   ADENOIDECTOMY     Family History  Problem Relation Age of Onset   Thyroid disease Mother    COPD Mother    Hypertension Father    Asthma Brother    Cancer Maternal Grandmother        colorectal cancer   Diabetes Maternal Grandfather    Heart disease Paternal Grandmother        CHF   Cancer Paternal Grandfather        lung   Heart disease Paternal Grandfather    Diabetes Maternal Aunt    Cancer Maternal Uncle 73       rectal   Stroke Neg Hx    Social History   Tobacco Use   Smoking status: Never   Smokeless tobacco: Former    Types: Snuff  Vaping Use   Vaping Use: Never used  Substance Use Topics   Alcohol use: Yes    Alcohol/week: 0.0 standard drinks    Comment: rarely   Drug use: No   Current Outpatient Medications  Medication Sig Dispense Refill   finasteride (PROPECIA) 1 MG tablet Take 1 tablet by mouth daily.     No current facility-administered medications for this visit.   No Known Allergies   Review of Systems: All systems  reviewed and negative except where noted in HPI.    CT CARDIAC SCORING ([facility] LOCATIONS ONLY)  Result Date: 12/10/2021 CLINICAL DATA:  Screening. EXAM: CT CARDIAC CORONARY ARTERY CALCIUM SCORE TECHNIQUE: Non-contrast imaging through the heart was performed using prospective ECG gating. Image post processing was performed on an independent workstation, allowing for quantitative analysis of the heart and coronary arteries. Note that this exam targets the heart and the chest was not imaged in its entirety. COMPARISON:  None. FINDINGS: CORONARY CALCIUM SCORES: Left Main: 0 LAD: 14.8 LCx: 0 RCA: 0 Total Agatston Score: 14.8 MESA database percentile: 84. Note, this is compared to 78 year olds, the youngest in the [place]. AORTA MEASUREMENTS: Ascending Aorta: 26 mm Descending Aorta: 21 mm OTHER FINDINGS: Heart is normal study. Aorta normal caliber. No adenopathy. Soft tissue in the anterior mediastinum felt to reflect residual thymus. No confluent opacities or effusions. Imaging into the upper abdomen demonstrates no acute findings. Chest wall soft tissues are unremarkable. No acute bony abnormality. IMPRESSION: Total Agatston score: 14.8 [place] database percentile: 84 when compared to 44 year olds, the youngest in the [place] No acute or significant extracardiac abnormality. Electronically Signed  By: Rolm Baptise M.D.   On: 12/10/2021 10:48    Physical Exam: There were no vitals taken for this visit. Constitutional: Pleasant,well-developed, Caucasian male in no acute distress. HEENT: Normocephalic and atraumatic. Conjunctivae are normal. No scleral icterus. Cardiovascular: Normal rate, regular rhythm.  Pulmonary/chest: Effort normal and breath sounds normal. No wheezing, rales or rhonchi. Abdominal: Soft, nondistended, nontender. Bowel sounds active throughout. There are no masses palpable. No hepatomegaly. Extremities: no edema Neurological: Alert and oriented to person place and time. Skin:  Skin is warm and dry. No rashes noted. Psychiatric: Normal mood and affect. Behavior is normal.  CBC    Component Value Date/Time   WBC 11.8 (H) 11/08/2021 1619   RBC 5.62 11/08/2021 1619   HGB 17.4 11/08/2021 1619   HCT 48.3 11/08/2021 1619   PLT 267 11/08/2021 1619   MCV 86 11/08/2021 1619   MCH 31.0 11/08/2021 1619   MCHC 36.0 (H) 11/08/2021 1619   RDW 12.2 11/08/2021 1619   LYMPHSABS 2.7 11/08/2021 1619   EOSABS 0.1 11/08/2021 1619   BASOSABS 0.1 11/08/2021 1619    CMP     Component Value Date/Time   NA 140 11/08/2021 1619   K 4.4 11/08/2021 1619   CL 101 11/08/2021 1619   CO2 24 11/08/2021 1619   GLUCOSE 88 11/08/2021 1619   BUN 10 11/08/2021 1619   CREATININE 1.04 11/08/2021 1619   CALCIUM 9.6 11/08/2021 1619   PROT 7.5 11/08/2021 1619   ALBUMIN 5.2 (H) 11/08/2021 1619   AST 22 11/08/2021 1619   ALT 32 11/08/2021 1619   ALKPHOS 86 11/08/2021 1619   BILITOT 0.8 11/08/2021 1619     ASSESSMENT AND PLAN: 35 year old male with two second degree relatives with colon cancer and first degree relative with polyps (details unknown) with history of scant hematochezia.  Although the hematochezia is almost certainly from internal hemorrhoids, given his family history and history of occupational carcinogen exposure, a colonoscopy to definitively exclude malignancy seems reasonable.  Will schedule patient for routine colonoscopy.  Family history of colon cancer - Colonoscopy  Hematochezia, scant, intermittent - Colonoscopy  The details, risks (including bleeding, perforation, infection, missed lesions, medication reactions and possible hospitalization or surgery if complications occur), benefits, and alternatives to colonoscopy with possible biopsy and possible polypectomy were discussed with the patient and he consents to proceed.   Sharnice Bosler E. Candis Schatz, MD [facility]   CC:  Glade Lloyd Camelia Eng, PA-C

## 2021-12-11 ENCOUNTER — Encounter: Payer: 59 | Admitting: Medical

## 2021-12-12 ENCOUNTER — Encounter: Payer: Self-pay | Admitting: Gastroenterology

## 2021-12-19 ENCOUNTER — Encounter: Payer: Self-pay | Admitting: Medical

## 2021-12-22 NOTE — Telephone Encounter (Signed)
Letter e-mailed to patient.

## 2022-01-23 HISTORY — PX: COLONOSCOPY: SHX174

## 2022-02-07 ENCOUNTER — Encounter: Payer: Self-pay | Admitting: Gastroenterology

## 2022-02-13 ENCOUNTER — Encounter: Payer: Self-pay | Admitting: Gastroenterology

## 2022-02-13 ENCOUNTER — Ambulatory Visit (AMBULATORY_SURGERY_CENTER): Payer: 59 | Admitting: Gastroenterology

## 2022-02-13 VITALS — BP 108/69 | HR 74 | Temp 98.6°F | Resp 22 | Ht 69.0 in | Wt 209.0 lb

## 2022-02-13 DIAGNOSIS — K635 Polyp of colon: Secondary | ICD-10-CM | POA: Diagnosis not present

## 2022-02-13 DIAGNOSIS — K921 Melena: Secondary | ICD-10-CM

## 2022-02-13 DIAGNOSIS — D123 Benign neoplasm of transverse colon: Secondary | ICD-10-CM

## 2022-02-13 MED ORDER — SODIUM CHLORIDE 0.9 % IV SOLN: 500.0000 mL | Freq: Once | INTRAVENOUS | Status: DC

## 2022-02-13 NOTE — Op Note (Signed)
[facility] Patient Name: Cory Barron Procedure Date: 02/13/2022 9:41 AM MRN: [id number] Endoscopist: Nicki Reaper E. Candis Schatz , MD Age: 36 Referring MD:  Date of Birth: 06/13/1986 Gender: Male Account #: [id number] Procedure:                Colonoscopy Indications:              Hematochezia Medicines:                Monitored Anesthesia Care Procedure:                Pre-Anesthesia Assessment:                           - Prior to the procedure, a History and Physical                            was performed, and patient medications and                            allergies were reviewed. The patient's tolerance of                            previous anesthesia was also reviewed. The risks                            and benefits of the procedure and the sedation                            options and risks were discussed with the patient.                            All questions were answered, and informed consent                            was obtained. Prior Anticoagulants: The patient has                            taken no previous anticoagulant or antiplatelet                            agents. ASA Grade Assessment: II - A patient with                            mild systemic disease. After reviewing the risks                            and benefits, the patient was deemed in                            satisfactory condition to undergo the procedure.                           After obtaining informed consent, the colonoscope  was passed under direct vision. Throughout the                            procedure, the patient's blood pressure, pulse, and                            oxygen saturations were monitored continuously. The                            Colonoscope was introduced through the anus and                            advanced to the the cecum, identified by                            appendiceal orifice and ileocecal valve. The                             colonoscopy was performed without difficulty. The                            patient tolerated the procedure well. The quality                            of the bowel preparation was good. The terminal                            ileum, ileocecal valve, appendiceal orifice, and                            rectum were photographed. Scope In: 9:50:04 AM Scope Out: 10:06:29 AM Scope Withdrawal Time: 0 hours 11 minutes 58 seconds  Total Procedure Duration: 0 hours 16 minutes 25 seconds  Findings:                 The perianal and digital rectal examinations were                            normal. Pertinent negatives include normal                            sphincter tone and no palpable rectal lesions.                           A 10 mm polyp was found in the hepatic flexure. The                            polyp was sessile. The polyp was removed with a                            cold snare. After the polypectomy, there appeared                            to be residual polyp at the margin.  This residual                            polypoid tissue was then removed with cold snare.                            Resection and retrieval were complete. Estimated                            blood loss was minimal.                           The exam was otherwise normal throughout the                            examined colon.                           The retroflexed view of the distal rectum and anal                            verge was normal and showed no anal or rectal                            abnormalities. Complications:            No immediate complications. Estimated Blood Loss:     Estimated blood loss was minimal. Impression:               - One 10 mm polyp at the hepatic flexure, removed                            with a cold snare. Resected and retrieved.                           - The distal rectum and anal verge are normal on                            retroflexion  view. Recommendation:           - Patient has a contact number available for                            emergencies. The signs and symptoms of potential                            delayed complications were discussed with the                            patient. Return to normal activities tomorrow.                            Written discharge instructions were provided to the                            patient.                           -  Resume previous diet.                           - Continue present medications.                           - Await pathology results.                           - Repeat colonoscopy (date not yet determined) for                            surveillance based on pathology results. Brecken Walth E. Candis Schatz, MD 02/13/2022 10:11:31 AM This report has been signed electronically.

## 2022-02-13 NOTE — Progress Notes (Signed)
No problems noted in the recovery room. maw 

## 2022-02-13 NOTE — Progress Notes (Signed)
Called to room to assist during endoscopic procedure.  Patient ID and intended procedure confirmed with present staff. Received instructions for my participation in the procedure from the performing physician.  

## 2022-02-13 NOTE — Progress Notes (Signed)
VS by CW. ?

## 2022-02-13 NOTE — Progress Notes (Signed)
PT taken to PACU. Monitors in place. VSS. Report given to RN. 

## 2022-02-13 NOTE — Progress Notes (Signed)
[facility] History and Physical   Primary Care Physician:  Carlena Hurl, PA-C   Reason for Procedure:   Hematochezia  Plan:    Colonoscopy     HPI: Cory Barron is a 36 y.o. male undergoing colonoscopy to evaluate occasional painless hematochezia.  He has two family members with colon cancer at young age (maternal uncle, 60s; maternal grandmother, 60s).  His mother has had polyps.  Past Medical History:  Diagnosis Date   Asthma    childhood asthma   Low HDL (under 40) 2015   RBBB    normal variant on EKG   Seasonal allergic rhinitis    spring    Past Surgical History:  Procedure Laterality Date   ADENOIDECTOMY      Prior to Admission medications   Medication Sig Start Date End Date Taking? Authorizing Provider  finasteride (PROPECIA) 1 MG tablet Take 1 tablet by mouth daily. 05/23/21  Yes [provider]    Current Outpatient Medications  Medication Sig Dispense Refill   finasteride (PROPECIA) 1 MG tablet Take 1 tablet by mouth daily.     Current Facility-Administered Medications  Medication Dose Route Frequency Provider Last Rate Last Admin   0.9 %  sodium chloride infusion  500 mL Intravenous Once Daryel November, MD        Allergies as of 02/13/2022   (No Known Allergies)    Family History  Problem Relation Age of Onset   Thyroid disease Mother    COPD Mother    Hypertension Father    Asthma Brother    Diabetes Maternal Aunt    Cancer Maternal Uncle 45       rectal   Rectal cancer Maternal Grandmother    Colon cancer Maternal Grandmother    Cancer Maternal Grandmother        colorectal cancer   Diabetes Maternal Grandfather    Heart disease Paternal Grandmother        CHF   Cancer Paternal Grandfather        lung   Heart disease Paternal Grandfather    Stroke Neg Hx    Esophageal cancer Neg Hx    Stomach cancer Neg Hx     Social History   Socioeconomic History   Marital status: Married    Spouse name: Not  on file   Number of children: 2   Years of education: Not on file   Highest education level: Not on file  Occupational History   Occupation: Mudlogger of training [employer]  Tobacco Use   Smoking status: Never   Smokeless tobacco: Former    Types: Snuff  Vaping Use   Vaping Use: Never used  Substance and Sexual Activity   Alcohol use: Yes    Alcohol/week: 0.0 standard drinks    Comment: rarely   Drug use: No   Sexual activity: Not on file    Comment: fireman, married, no children, exercise - some  Other Topics Concern   Not on file  Social History Narrative   Married, active on job, some exercise outside of work, 2 children, 31yo body,2yo daughter, Airline pilot.  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  [employer], Radio broadcast assistant.  Lives in [place].  Wife is nurse at [employer]  10/2021   Social Determinants of Health   Financial Resource Strain: Not on file  Food Insecurity: Not on file  Transportation Needs: Not on file  Physical Activity: Not on file  Stress:  Not on file  Social Connections: Not on file  Intimate Partner Violence: Not on file    Review of Systems:  All other review of systems negative except as mentioned in the HPI.  Physical Exam: Vital signs BP 114/70    Pulse 95    Temp 98.6 F (37 C)    Ht 5\' 9"  (1.753 m)    Wt 209 lb (94.8 kg)    SpO2 97%    BMI 30.86 kg/m   General:   Alert,  Well-developed, well-nourished, pleasant and cooperative in NAD Airway:  Mallampati 1 Lungs:  Clear throughout to auscultation.   Heart:  Regular rate and rhythm; no murmurs, clicks, rubs,  or gallops. Abdomen:  Soft, nontender and nondistended. Normal bowel sounds.   Neuro/Psych:  Normal mood and affect. A and O x 3   Reegan Bouffard E. Candis Schatz, MD [facility]

## 2022-02-13 NOTE — Patient Instructions (Addendum)
Handout was given to your care partner on polyps. You may resume your current medications today. Await biopsy results.  May take 1-3 weeks to receive pathology results. Please call if any questions or concerns.    YOU HAD AN ENDOSCOPIC PROCEDURE TODAY AT THE [facility]:   Refer to the procedure report that was given to you for any specific questions about what was found during the examination.  If the procedure report does not answer your questions, please call your gastroenterologist to clarify.  If you requested that your care partner not be given the details of your procedure findings, then the procedure report has been included in a sealed envelope for you to review at your convenience later.  YOU SHOULD EXPECT: Some feelings of bloating in the abdomen. Passage of more gas than usual.  Walking can help get rid of the air that was put into your GI tract during the procedure and reduce the bloating. If you had a lower endoscopy (such as a colonoscopy or flexible sigmoidoscopy) you may notice spotting of blood in your stool or on the toilet paper. If you underwent a bowel prep for your procedure, you may not have a normal bowel movement for a few days.  Please Note:  You might notice some irritation and congestion in your nose or some drainage.  This is from the oxygen used during your procedure.  There is no need for concern and it should clear up in a day or so.  SYMPTOMS TO REPORT IMMEDIATELY:  Following lower endoscopy (colonoscopy or flexible sigmoidoscopy):  Excessive amounts of blood in the stool  Significant tenderness or worsening of abdominal pains  Swelling of the abdomen that is new, acute  Fever of 100F or higher    For urgent or emergent issues, a gastroenterologist can be reached at any hour by calling [phone]. Do not use MyChart messaging for urgent concerns.    DIET:  We do recommend a small meal at first, but then you may proceed to your regular  diet.  Drink plenty of fluids but you should avoid alcoholic beverages for 24 hours.  ACTIVITY:  You should plan to take it easy for the rest of today and you should NOT DRIVE or use heavy machinery until tomorrow (because of the sedation medicines used during the test).    FOLLOW UP: Our staff will call the number listed on your records 48-72 hours following your procedure to check on you and address any questions or concerns that you may have regarding the information given to you following your procedure. If we do not reach you, we will leave a message.  We will attempt to reach you two times.  During this call, we will ask if you have developed any symptoms of COVID 19. If you develop any symptoms (ie: fever, flu-like symptoms, shortness of breath, cough etc.) before then, please call [phone].  If you test positive for Covid 19 in the 2 weeks post procedure, please call and report this information to us.    If any biopsies were taken you will be contacted by phone or by letter within the next 1-3 weeks.  Please call us at [phone] if you have not heard about the biopsies in 3 weeks.    SIGNATURES/CONFIDENTIALITY: You and/or your care partner have signed paperwork which will be entered into your electronic medical record.  These signatures attest to the fact that that the information above on your After Visit Summary has been   reviewed and is understood.  Full responsibility of the confidentiality of this discharge information lies with you and/or your care-partner.    

## 2022-02-15 ENCOUNTER — Telehealth: Payer: Self-pay | Admitting: *Deleted

## 2022-02-15 NOTE — Telephone Encounter (Signed)
°  Follow up Call-  Call back number 02/13/2022  Post procedure Call Back phone  # [phone]  Permission to leave phone message Yes  Some recent data might be hidden     Patient questions:  Do you have a fever, pain , or abdominal swelling? No. Pain Score  0 *  Have you tolerated food without any problems? Yes.    Have you been able to return to your normal activities? Yes.    Do you have any questions about your discharge instructions: Diet   No. Medications  No. Follow up visit  No.  Do you have questions or concerns about your Care? Yes.  Questions answered to patient's satisfaction.   Actions: * If pain score is 4 or above: No action needed, pain <4.  Have you developed a fever since your procedure? no  2.   Have you had an respiratory symptoms (SOB or cough) since your procedure? no  3.   Have you tested positive for COVID 19 since your procedure no  4.   Have you had any family members/close contacts diagnosed with the COVID 19 since your procedure?  no   If yes to any of these questions please route to Joylene John, RN and Joella Prince, RN

## 2022-02-18 NOTE — Progress Notes (Signed)
Cory Barron The polyp that I removed during your recent procedure was completely benign but was proven to be a "pre-cancerous" polyp that MAY have grown into cancers if it had not been removed.  Studies shows that at least 20% of women over age 36 and 30% of men over age 2 have pre-cancerous polyps.  Based on current nationally recognized surveillance guidelines, I recommend that you have a repeat colonoscopy in 3 years.   If you develop any new rectal bleeding, abdominal pain or significant bowel habit changes, please contact me before then.

## 2022-08-28 ENCOUNTER — Encounter: Payer: Self-pay | Admitting: Internal Medicine

## 2022-10-01 ENCOUNTER — Encounter: Payer: Self-pay | Admitting: Internal Medicine

## 2022-11-22 ENCOUNTER — Ambulatory Visit: Payer: 59 | Admitting: Medical

## 2022-11-22 ENCOUNTER — Encounter: Payer: Self-pay | Admitting: Medical

## 2022-11-22 VITALS — BP 110/68 | HR 74 | Temp 97.5°F | Ht 69.5 in | Wt 209.2 lb

## 2022-11-22 DIAGNOSIS — I4510 Unspecified right bundle-branch block: Secondary | ICD-10-CM | POA: Diagnosis not present

## 2022-11-22 DIAGNOSIS — J4520 Mild intermittent asthma, uncomplicated: Secondary | ICD-10-CM

## 2022-11-22 DIAGNOSIS — Z23 Encounter for immunization: Secondary | ICD-10-CM

## 2022-11-22 DIAGNOSIS — R051 Acute cough: Secondary | ICD-10-CM | POA: Diagnosis not present

## 2022-11-22 DIAGNOSIS — Z0000 Encounter for general adult medical examination without abnormal findings: Secondary | ICD-10-CM

## 2022-11-22 DIAGNOSIS — Z6830 Body mass index (BMI) 30.0-30.9, adult: Secondary | ICD-10-CM

## 2022-11-22 DIAGNOSIS — Z7185 Encounter for immunization safety counseling: Secondary | ICD-10-CM

## 2022-11-22 DIAGNOSIS — Z800 Family history of malignant neoplasm of digestive organs: Secondary | ICD-10-CM

## 2022-11-22 DIAGNOSIS — Z13220 Encounter for screening for lipoid disorders: Secondary | ICD-10-CM

## 2022-11-22 DIAGNOSIS — Z569 Unspecified problems related to employment: Secondary | ICD-10-CM

## 2022-11-22 LAB — POCT URINALYSIS DIP (PROADVANTAGE DEVICE)
Bilirubin, UA: NEGATIVE
Blood, UA: NEGATIVE
Glucose, UA: NEGATIVE mg/dL
Leukocytes, UA: NEGATIVE
Nitrite, UA: NEGATIVE
Protein Ur, POC: NEGATIVE mg/dL
Specific Gravity, Urine: 1.01
Urobilinogen, Ur: NEGATIVE
pH, UA: 7 (ref 5.0–8.0)

## 2022-11-22 LAB — POCT INFLUENZA A/B
Influenza A, POC: NEGATIVE
Influenza B, POC: NEGATIVE

## 2022-11-22 LAB — POC COVID19 BINAXNOW: SARS Coronavirus 2 Ag: NEGATIVE

## 2022-11-22 NOTE — Progress Notes (Signed)
Subjective:   HPI  Cory Barron is a 36 y.o. male who presents for Chief Complaint  Patient presents with   fasting cpe    Fasting cpe, sick- nasal congestion, runny nose, Sore throat, cough- started yesterday afternoon, covid and flu negative.     Patient Care Team: Camillo Quadros, Leward Quan as PCP - General Sees dentist Sees eye doctor Dr. Dustin Flock, GI   Concerns: Has 2 day hx/o cold symptoms, cough, runny nose, sore throat.  No sick contacts.  No body aches, chills or fever.   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  Otherwise normal state of health  Reviewed their medical, surgical, family, social, medication, and allergy history and updated chart as appropriate.  Past Medical History:  Diagnosis Date   Asthma    childhood asthma   Low HDL (under 40) 2015   RBBB    normal variant on EKG   Seasonal allergic rhinitis    spring    Past Surgical History:  Procedure Laterality Date   ADENOIDECTOMY      Family History  Problem Relation Age of Onset   Thyroid disease Mother    COPD Mother    Hypertension Father    Asthma Brother    Diabetes Maternal Aunt    Cancer Maternal Uncle 45       rectal   Rectal cancer Maternal Grandmother    Colon cancer Maternal Grandmother    Cancer Maternal Grandmother        colorectal cancer   Diabetes Maternal Grandfather    Heart disease Paternal Grandmother        CHF   Cancer Paternal Grandfather        lung   Heart disease Paternal Grandfather    Stroke Neg Hx    Esophageal cancer Neg Hx    Stomach cancer Neg Hx      Current Outpatient Medications:    Finasteride-Minoxidil 0.1-7 % SOLN, Apply 1 spray topically. daily, Disp: , Rfl:   No Known Allergies   Review of Systems Constitutional: -fever, -chills, -sweats, -unexpected weight change, -decreased appetite, -fatigue Allergy: -sneezing, -itching,  -congestion Dermatology: -changing moles, --rash, -lumps ENT: -runny nose, -ear pain, -sore throat, -hoarseness, -sinus pain, -teeth pain, - ringing in ears, -hearing loss, -nosebleeds Cardiology: -chest pain, -palpitations, -swelling, -difficulty breathing when lying flat, -waking up short of breath Respiratory: -cough, -shortness of breath, -difficulty breathing with exercise or exertion, -wheezing, -coughing up blood Gastroenterology: -abdominal pain, -nausea, -vomiting, -diarrhea, -constipation, -blood in stool, -changes in bowel movement, -difficulty swallowing or eating Hematology: -bleeding, -bruising  Musculoskeletal: -joint aches, -muscle aches, -joint swelling, -back pain, -neck pain, -cramping, -changes in gait Ophthalmology: denies vision changes, eye redness, itching, discharge Urology: -burning with urination, -difficulty urinating, -blood in urine, -urinary frequency, -urgency, -incontinence Neurology: -headache, -weakness, -tingling, -numbness, -memory loss, -falls, -dizziness Psychology: -depressed mood, -agitation, -sleep problems Male GU: no testicular mass, pain, no lymph nodes swollen, no swelling, no rash.     11/22/2022    3:18 PM 11/08/2021    3:33 PM 07/11/2020   12:17 PM 03/16/2019   10:42 AM 03/11/2018    3:21 PM  Depression screen PHQ 2/9  Decreased Interest 0 0 0 0 0  Down, Depressed, Hopeless 0 0 0 0 0  PHQ - 2 Score 0 0 0 0 0        Objective:  BP  110/68   Pulse 74   Temp (!) 97.5 F (36.4 C)   Ht 5' 9.5" (1.765 m)   Wt 209 lb 3.2 oz (94.9 kg)   BMI 30.45 kg/m   General appearance: alert, no distress, WD/WN, Caucasian male Skin: scattered macules, no worrisome lesions HEENT: normocephalic, conjunctiva/corneas normal, sclerae anicteric, PERRLA, EOMi Neck: supple, no lymphadenopathy, no thyromegaly, no masses, normal ROM, no bruits Chest: non tender, normal shape and expansion Heart: RRR, normal S1, S2, no murmurs Lungs: CTA bilaterally, no  wheezes, rhonchi, or rales Abdomen: +bs, soft, non tender, non distended, no masses, no hepatomegaly, no splenomegaly, no bruits Back: non tender, normal ROM, no scoliosis Musculoskeletal:  unremarkable limited exam Extremities: no edema, no cyanosis, no clubbing Pulses: 2+ symmetric, upper and lower extremities, normal cap refill Neurological: alert, oriented x 3, CN2-12 intact, strength normal upper extremities and lower extremities, sensation normal throughout, DTRs 2+ throughout, no cerebellar signs, gait normal Psychiatric: normal affect, behavior normal, pleasant  GU: normal male external genitalia,circumcised, nontender, no masses, no hernia, no lymphadenopathy Rectal: deferred  PFT reviewed   Assessment and Plan :   Encounter Diagnoses  Name Primary?   Encounter for health maintenance examination in adult Yes   Acute cough    Routine general medical examination at a health care facility    Mild intermittent asthma, unspecified whether complicated    RBBB    Vaccine counseling    Screening for lipid disorders    Needs flu shot    Need for COVID-19 vaccine    Family history of colon cancer    BMI 30.0-30.9,adult    Adverse exposure in workplace     This visit was a preventative care visit, also known as wellness visit or routine physical.   Topics typically include healthy lifestyle, diet, exercise, preventative care, vaccinations, sick and well care, proper use of emergency dept and after hours care, as well as other concerns.     Recommendations: Continue to return yearly for your annual wellness and preventative care visits.  This gives [place] a chance to discuss healthy lifestyle, exercise, vaccinations, review your chart record, and perform screenings where appropriate.  I recommend you see your eye doctor yearly for routine vision care.  I recommend you see your dentist yearly for routine dental care including hygiene visits twice yearly.   Vaccination  recommendations were reviewed Immunization History  Administered Date(s) Administered   COVID-19, mRNA, vaccine(Comirnaty)12 years and older 11/22/2022   Hepatitis B 10/14/2012, 11/11/2012, 03/08/2013   Influenza Split 10/07/2015   Influenza,inj,Quad PF,6+ Mos 10/19/2020, 11/08/2021, 11/22/2022   Influenza-Unspecified 10/07/2014, 09/22/2018, 08/24/2019   Moderna Covid-19 Vaccine Bivalent Booster 79yr & up 11/08/2021   Moderna Sars-Covid-2 Vaccination 12/27/2019, 01/29/2020, 10/19/2020   Td 02/25/2017   Tdap 09/03/2007    Counseled on the influenza virus vaccine.  Vaccine information sheet given.  Influenza vaccine given after consent obtained.  Counseled on the Covid virus vaccine.  Vaccine information sheet given.  Covid vaccine given after consent obtained.    Screening for cancer: Colon cancer screening: I reviewed your 2023 colonoscopy, repeat in 3 years  Testicular cancer screening You should do a monthly self testicular exam if you are between [id number] old  We discussed PSA, prostate exam, and prostate cancer screening risks/benefits.   Age 36 Skin cancer screening: Check your skin regularly for new changes, growing lesions, or other lesions of concern Come in for evaluation if you have skin lesions of concern.  Lung cancer screening:  If you have a greater than 20 pack year history of tobacco use, then you may qualify for lung cancer screening with a chest CT scan.   Please call your insurance company to inquire about coverage for this test.  We currently don't have screenings for other cancers besides breast, cervical, colon, and lung cancers.  If you have a strong family history of cancer or have other cancer screening concerns, please let me know.    Bone health: Get at least 150 minutes of aerobic exercise weekly Get weight bearing exercise at least once weekly Bone density test:  A bone density test is an imaging test that uses a type of X-ray to measure the  amount of calcium and other minerals in your bones. The test may be used to diagnose or screen you for a condition that causes weak or thin bones (osteoporosis), predict your risk for a broken bone (fracture), or determine how well your osteoporosis treatment is working. The bone density test is recommended for females 2 and older, or females or males <41 if certain risk factors such as thyroid disease, long term use of steroids such as for asthma or rheumatological issues, vitamin D deficiency, estrogen deficiency, family history of osteoporosis, self or family history of fragility fracture in first degree relative.    Heart health: Get at least 150 minutes of aerobic exercise weekly Limit alcohol It is important to maintain a healthy blood pressure and healthy cholesterol numbers  Heart disease screening: Screening for heart disease includes screening for blood pressure, fasting lipids, glucose/diabetes screening, BMI height to weight ratio, reviewed of smoking status, physical activity, and diet.    Goals include blood pressure 120/80 or less, maintaining a healthy lipid/cholesterol profile, preventing diabetes or keeping diabetes numbers under good control, not smoking or using tobacco products, exercising most days per week or at least 150 minutes per week of exercise, and eating healthy variety of fruits and vegetables, healthy oils, and avoiding unhealthy food choices like fried food, fast food, high sugar and high cholesterol foods.    Other tests may possibly include EKG test, CT coronary calcium score, echocardiogram, exercise treadmill stress test.   Your 2022 CT coronary scan shows mild cholesterol plaque in 1 coronary and no lung lesions.  Consider cholesterol lower medication and low cholesterol diet   Medical care options: I recommend you continue to seek care here first for routine care.  We try really hard to have available appointments Monday through Friday daytime hours for  sick visits, acute visits, and physicals.  Urgent care should be used for after hours and weekends for significant issues that cannot wait till the next day.  The emergency department should be used for significant potentially life-threatening emergencies.  The emergency department is expensive, can often have long wait times for less significant concerns, so try to utilize primary care, urgent care, or telemedicine when possible to avoid unnecessary trips to the emergency department.  Virtual visits and telemedicine have been introduced since the pandemic started in 2020, and can be convenient ways to receive medical care.  We offer virtual appointments as well to assist you in a variety of options to seek medical care.    Separate significant issues discussed: BMI > 30 work on efforts to lose weight through health diet and exercise  Abnormal lipids- recheck fasting lipids today.  Consider medicaiton given mild atherosclerosis on 2022 CT coronary test  Recommendations for improving lipids:  Foods TO AVOID or limit - fried foods,  high sugar foods, white bread, enriched flour, fast food, red meat, large amounts of cheese, processed foods such as little debbie cakes, cookies, pies, donuts, for example  Foods TO INCLUDE in the diet - whole grains such as whole grain pasta, whole grain bread, barley, steel cut oatmeal (not instant oatmeal), avocado, fish, green leafy vegetables, nuts, increased fiber in diet, and using olive oil in small amounts for cooking or as salad dressing vinaigrette.    Asthma-no recent problems.  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  We discussed possibly doing EKG every couple years or every other year.  We discussed PFT yearly or every other yearly.  We will try to continue doing a panel of labs.  We discussed that there is no one comprehensive lab panel of blood work for screening for cancers in general so we have to be vigilant about routine  screenings  RBBB - unchanged on 2022 EKG  URI symptoms - rest, hydrate well, consider robitussin DM, and if worse recheck   Rich was seen today for fasting cpe.  Diagnoses and all orders for this visit:  Encounter for health maintenance examination in adult -     Comprehensive metabolic panel -     CBC with Differential/Platelet -     Lipid panel -     Hepatitis C antibody -     Hepatitis B surface antigen -     HIV Antibody (routine testing w rflx) -     POCT Urinalysis DIP (Proadvantage Device)  Acute cough -     POC COVID-19 -     Influenza A/B -     Spirometry with Graph  Routine general medical examination at a health care facility  Mild intermittent asthma, unspecified whether complicated  RBBB  Vaccine counseling  Screening for lipid disorders -     Lipid panel  Needs flu shot -     Flu Vaccine QUAD 45moIM (Fluarix, Fluzone & Alfiuria Quad PF)  Need for COVID-19 vaccine -[employer] [place] [employer] 2023 Covid-19 Vaccine 153yrand older  Family history of colon cancer  BMI 30.0-30.9,adult  Adverse exposure in workplace -     Hepatitis C antibody -     Hepatitis B surface antigen -     HIV Antibody (routine testing w rflx)    Follow-up pending labs, yearly for physical

## 2022-11-23 LAB — COMPREHENSIVE METABOLIC PANEL
ALT: 25 IU/L (ref 0–44)
AST: 22 IU/L (ref 0–40)
Albumin/Globulin Ratio: 2.2 (ref 1.2–2.2)
Albumin: 5.1 g/dL (ref 4.1–5.1)
Alkaline Phosphatase: 89 IU/L (ref 44–121)
BUN/Creatinine Ratio: 8 — ABNORMAL LOW (ref 9–20)
BUN: 8 mg/dL (ref 6–20)
Bilirubin Total: 0.5 mg/dL (ref 0.0–1.2)
CO2: 23 mmol/L (ref 20–29)
Calcium: 9.4 mg/dL (ref 8.7–10.2)
Chloride: 100 mmol/L (ref 96–106)
Creatinine, Ser: 0.96 mg/dL (ref 0.76–1.27)
Globulin, Total: 2.3 g/dL (ref 1.5–4.5)
Glucose: 81 mg/dL (ref 70–99)
Potassium: 4.2 mmol/L (ref 3.5–5.2)
Sodium: 139 mmol/L (ref 134–144)
Total Protein: 7.4 g/dL (ref 6.0–8.5)
eGFR: 105 mL/min/{1.73_m2} (ref >59)

## 2022-11-23 LAB — LIPID PANEL
Chol/HDL Ratio: 4.4 ratio (ref 0.0–5.0)
Cholesterol, Total: 225 mg/dL — ABNORMAL HIGH (ref 100–199)
HDL: 51 mg/dL (ref >39)
LDL Chol Calc (NIH): 159 mg/dL — ABNORMAL HIGH (ref 0–99)
Triglycerides: 87 mg/dL (ref 0–149)
VLDL Cholesterol Cal: 15 mg/dL (ref 5–40)

## 2022-11-23 LAB — CBC WITH DIFFERENTIAL/PLATELET
Basophils Absolute: 0.1 10*3/uL (ref 0.0–0.2)
Basos: 1 %
EOS (ABSOLUTE): 0.1 10*3/uL (ref 0.0–0.4)
Eos: 0 %
Hematocrit: 48.7 % (ref 37.5–51.0)
Hemoglobin: 16.8 g/dL (ref 13.0–17.7)
Immature Grans (Abs): 0 10*3/uL (ref 0.0–0.1)
Immature Granulocytes: 0 %
Lymphocytes Absolute: 2.3 10*3/uL (ref 0.7–3.1)
Lymphs: 20 %
MCH: 30.9 pg (ref 26.6–33.0)
MCHC: 34.5 g/dL (ref 31.5–35.7)
MCV: 90 fL (ref 79–97)
Monocytes Absolute: 0.8 10*3/uL (ref 0.1–0.9)
Monocytes: 7 %
Neutrophils Absolute: 8 10*3/uL — ABNORMAL HIGH (ref 1.4–7.0)
Neutrophils: 72 %
Platelets: 266 10*3/uL (ref 150–450)
RBC: 5.44 x10E6/uL (ref 4.14–5.80)
RDW: 12 % (ref 11.6–15.4)
WBC: 11.2 10*3/uL — ABNORMAL HIGH (ref 3.4–10.8)

## 2022-11-23 LAB — HEPATITIS C ANTIBODY: Hep C Virus Ab: NONREACTIVE

## 2022-11-23 LAB — HIV ANTIBODY (ROUTINE TESTING W REFLEX): HIV Screen 4th Generation wRfx: NONREACTIVE

## 2022-11-23 LAB — HEPATITIS B SURFACE ANTIGEN: Hepatitis B Surface Ag: NEGATIVE

## 2022-11-25 ENCOUNTER — Other Ambulatory Visit: Payer: Self-pay | Admitting: Medical

## 2022-11-25 ENCOUNTER — Other Ambulatory Visit: Payer: Self-pay | Admitting: Internal Medicine

## 2022-11-25 DIAGNOSIS — D72829 Elevated white blood cell count, unspecified: Secondary | ICD-10-CM

## 2022-11-25 MED ORDER — ROSUVASTATIN CALCIUM 5 MG PO TABS: 5.0000 mg | ORAL_TABLET | ORAL | 2 refills | Status: DC

## 2023-02-27 ENCOUNTER — Other Ambulatory Visit: Payer: 59

## 2023-02-27 DIAGNOSIS — D72829 Elevated white blood cell count, unspecified: Secondary | ICD-10-CM

## 2023-03-01 LAB — CBC WITH DIFFERENTIAL/PLATELET
Basophils Absolute: 0.1 10*3/uL (ref 0.0–0.2)
Basos: 1 %
EOS (ABSOLUTE): 0.1 10*3/uL (ref 0.0–0.4)
Eos: 1 %
Hematocrit: 48.9 % (ref 37.5–51.0)
Hemoglobin: 16.1 g/dL (ref 13.0–17.7)
Immature Grans (Abs): 0.1 10*3/uL (ref 0.0–0.1)
Immature Granulocytes: 1 %
Lymphocytes Absolute: 2.3 10*3/uL (ref 0.7–3.1)
Lymphs: 31 %
MCH: 30.1 pg (ref 26.6–33.0)
MCHC: 32.9 g/dL (ref 31.5–35.7)
MCV: 91 fL (ref 79–97)
Monocytes Absolute: 0.7 10*3/uL (ref 0.1–0.9)
Monocytes: 9 %
Neutrophils Absolute: 4.3 10*3/uL (ref 1.4–7.0)
Neutrophils: 57 %
Platelets: 219 10*3/uL (ref 150–450)
RBC: 5.35 x10E6/uL (ref 4.14–5.80)
RDW: 12.6 % (ref 11.6–15.4)
WBC: 7.5 10*3/uL (ref 3.4–10.8)

## 2023-03-01 LAB — LACTATE DEHYDROGENASE, ISOENZYMES
(LD) Fraction 1: 15 % — ABNORMAL LOW (ref 17–32)
(LD) Fraction 2: 32 % (ref 25–40)
(LD) Fraction 3: 22 % (ref 17–27)
(LD) Fraction 4: 12 % (ref 5–13)
(LD) Fraction 5: 19 % (ref 4–20)
LDH: 217 IU/L (ref 121–224)

## 2023-03-03 NOTE — Progress Notes (Signed)
Results sent through My Chart  The LDH ordered was not correct. It should have been plain LDH not isoenzymes.  Either way, labs were normal this time.

## 2023-03-05 ENCOUNTER — Telehealth: Payer: Self-pay

## 2023-03-05 NOTE — Telephone Encounter (Signed)
Pt. Called stating he saw his labs online but there was no cholesterol panel done and he thought that was the whole reason he was having labs done.

## 2023-06-14 IMAGING — CT CT CARDIAC CORONARY ARTERY CALCIUM SCORE
3 series · 14 of 20 positions shown, 16 images · non-contrast
Comparison: None.

CLINICAL DATA: Screening.

EXAM:
CT CARDIAC CORONARY ARTERY CALCIUM SCORE
TECHNIQUE: Non-contrast imaging through the heart was performed using
prospective ECG gating. Image post processing was performed on an
independent workstation, allowing for quantitative analysis of the
heart and coronary arteries. Note that this exam targets the heart
and the chest was not imaged in its entirety.

[Series 2: calcium scoring 2.00 qr36 bestdiast 72% hrt calciu · axial · 0.39mm/px · z∈[+1563,+1647]mm · 4 of 70 slices shown]
[im 14/70  vessel]
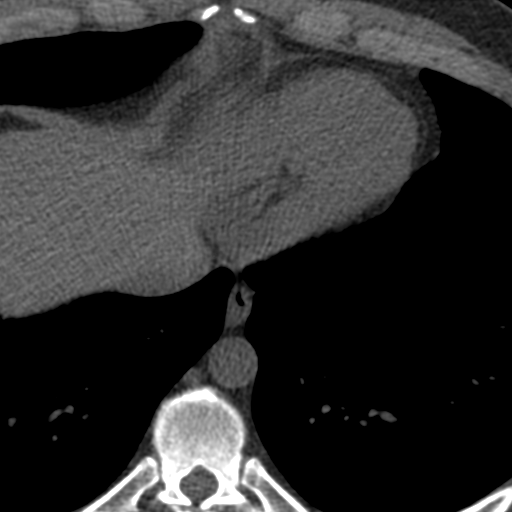
[im 28/70  vessel]
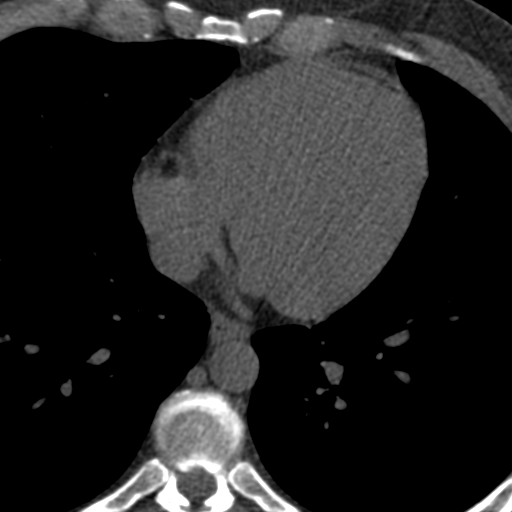
[im 42/70  vessel]
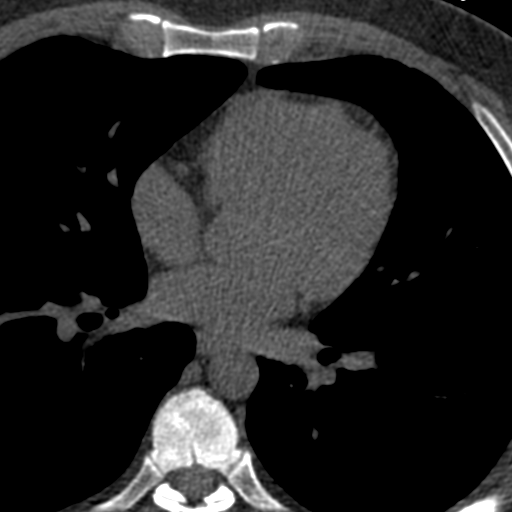
[im 56/70  vessel]
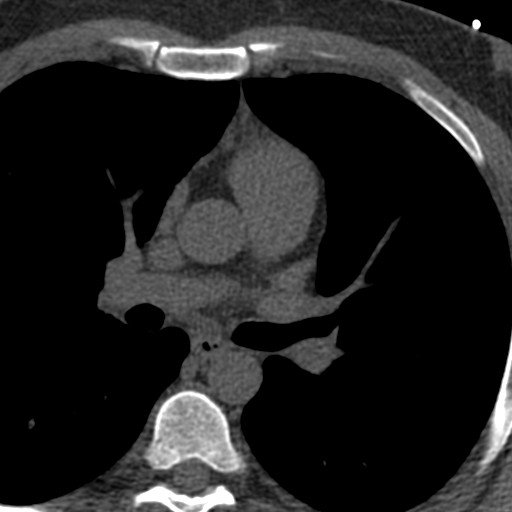

[Series 3: calcium scoring 2.00 br40 bestdiast 72% axial · axial · 0.58mm/px · z∈[+1559,+1651]mm · 5 of 70 slices shown, 7 images]
[im 12/70  vessel]
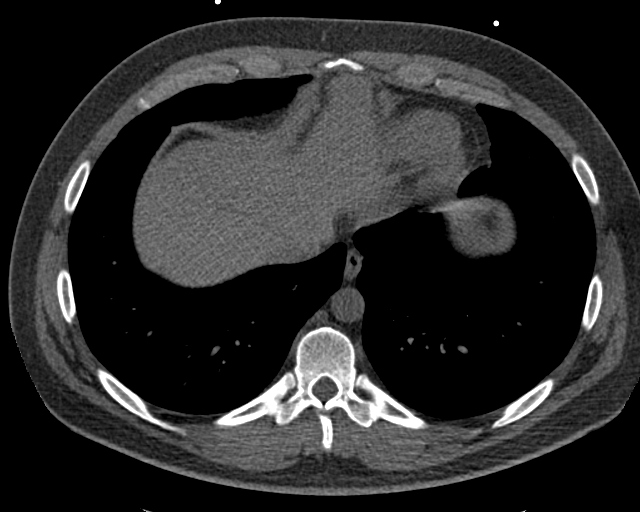
[im 12/70  lung]
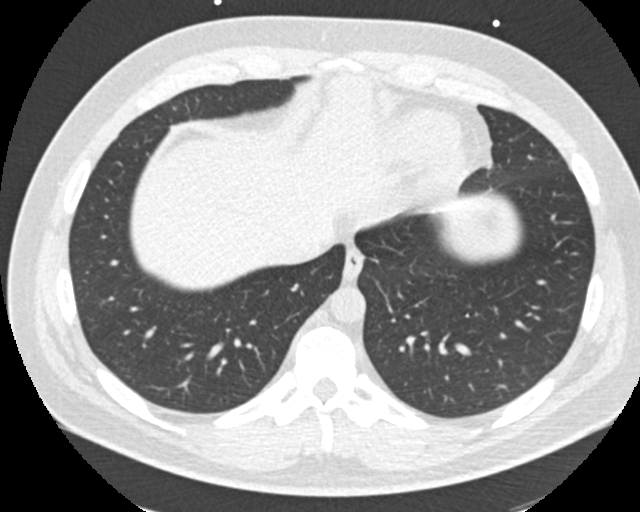
[im 24/70  vessel]
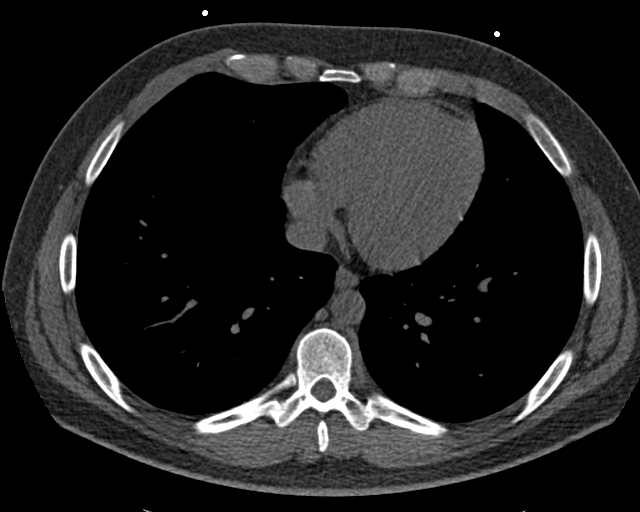
[im 35/70  vessel]
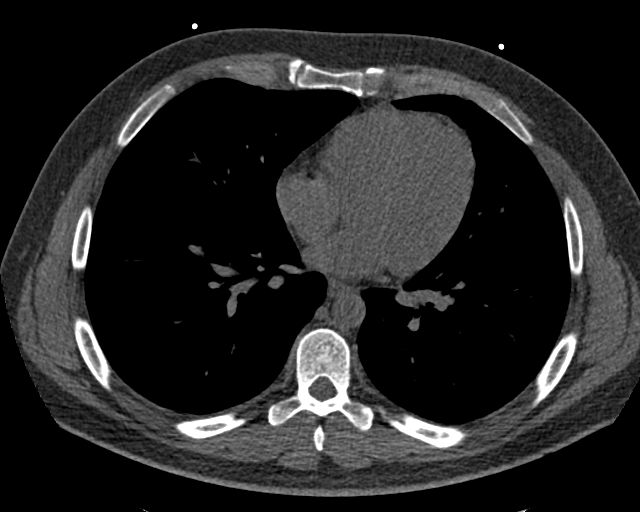
[im 47/70  vessel]
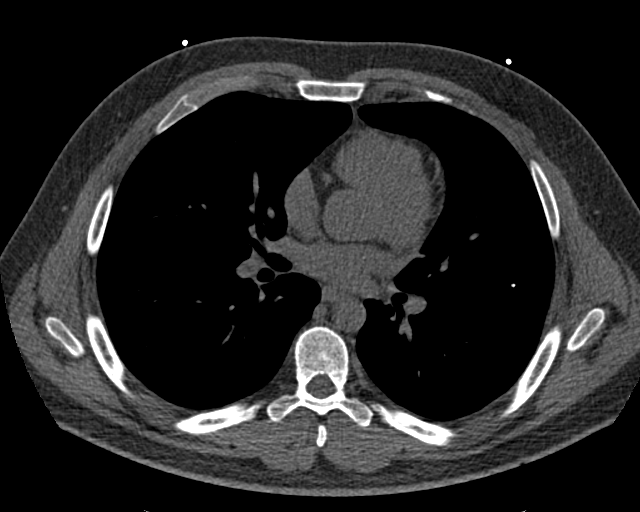
[im 58/70  vessel]
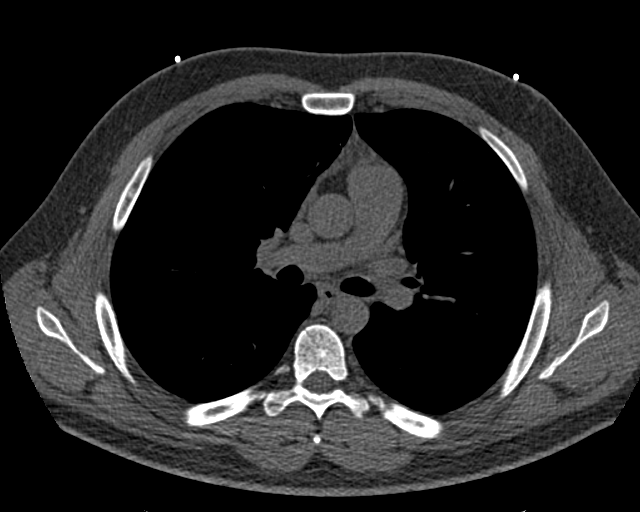
[im 58/70  lung]
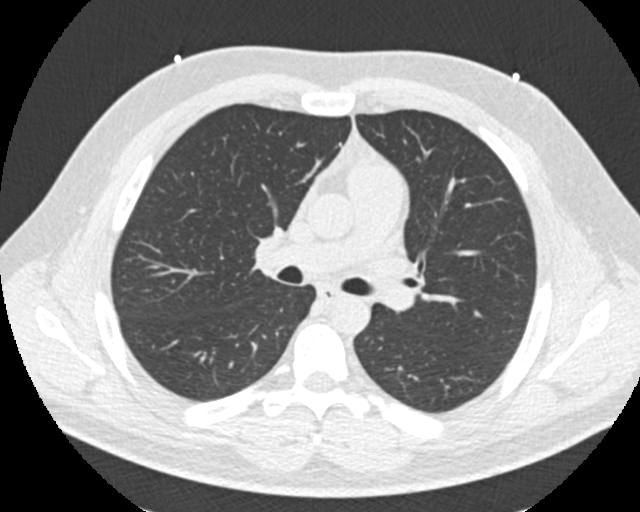

[Series 9: calcium scoring 2.00 br60 bestdiast 72% lungs · axial · 0.58mm/px · z∈[+1559,+1651]mm · 5 of 70 slices shown]
[im 12/70  vessel]
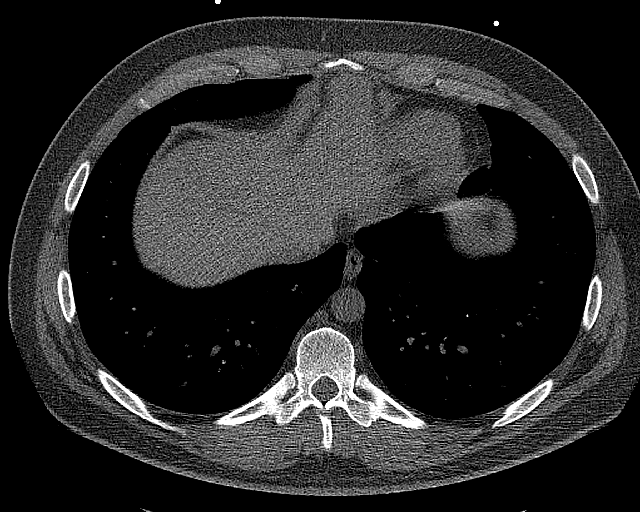
[im 24/70  vessel]
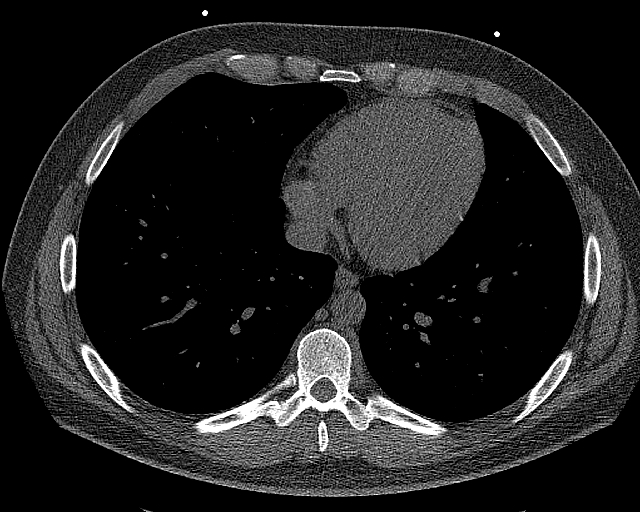
[im 35/70  vessel]
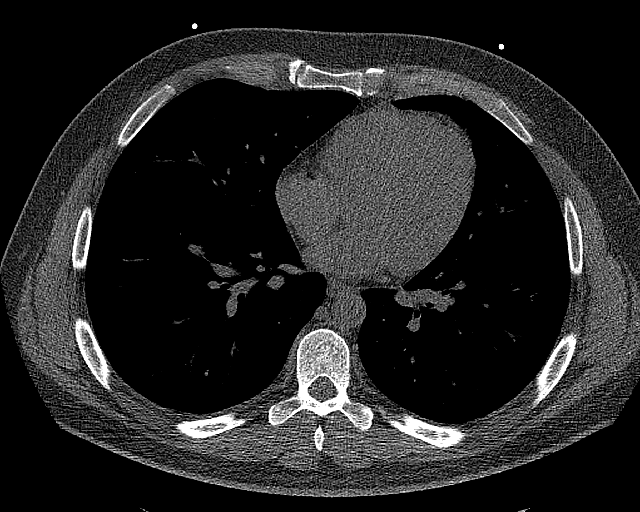
[im 47/70  vessel]
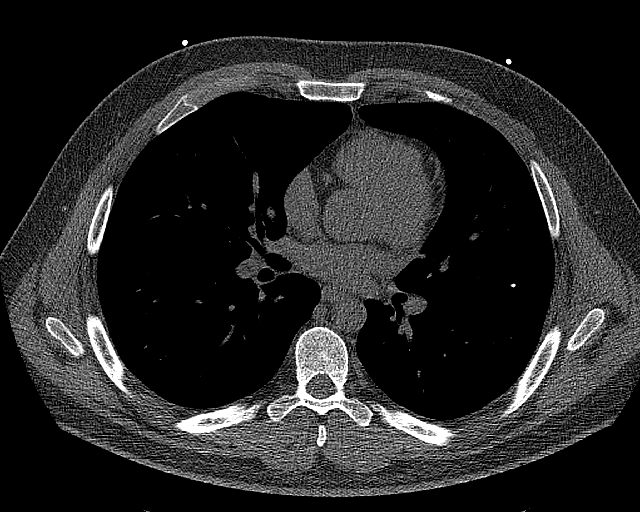
[im 58/70  vessel]
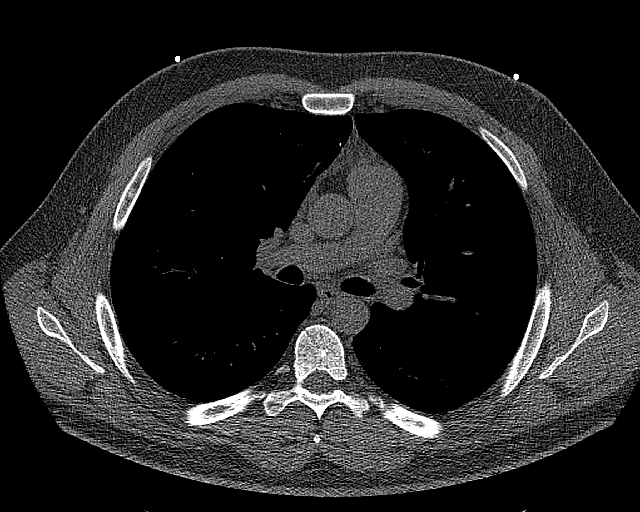

[14 of 20 positions shown; findings below may reference images not displayed]

FINDINGS: CORONARY CALCIUM SCORES:

Left Main: 0

LAD:

LCx: 0

RCA: 0

Total Agatston Score:

[HOSPITAL] percentile: 84. Note, this is compared to 45 year
olds, the youngest in the [HOSPITAL].

AORTA MEASUREMENTS:

Ascending Aorta: 26 mm

Descending Aorta: 21 mm

OTHER FINDINGS:

Heart is normal study. Aorta normal caliber. No adenopathy. Soft
tissue in the anterior mediastinum felt to reflect residual thymus.
No confluent opacities or effusions. Imaging into the upper abdomen
demonstrates no acute findings. Chest wall soft tissues are
unremarkable. No acute bony abnormality.
IMPRESSION: Total Agatston score:

[HOSPITAL] percentile: 84 when compared to 45 year olds, the
youngest in the [HOSPITAL]

No acute or significant extracardiac abnormality.

## 2023-11-24 ENCOUNTER — Ambulatory Visit: Payer: 59 | Admitting: Medical

## 2023-11-24 ENCOUNTER — Encounter: Payer: Self-pay | Admitting: Medical

## 2023-11-24 VITALS — BP 120/76 | HR 78 | Ht 69.5 in | Wt 212.6 lb

## 2023-11-24 DIAGNOSIS — Z13220 Encounter for screening for lipoid disorders: Secondary | ICD-10-CM

## 2023-11-24 DIAGNOSIS — Z0000 Encounter for general adult medical examination without abnormal findings: Secondary | ICD-10-CM | POA: Diagnosis not present

## 2023-11-24 NOTE — Progress Notes (Signed)
Subjective:   HPI  Cory Barron is a 37 y.o. male who presents for Chief Complaint  Patient presents with   Annual Exam    Fasting cpe, no concerns.     Patient Care Team: October Peery, Cleda Mccreedy as PCP - General Sees dentist Sees eye doctor Dr. Tiajuana Amass, GI   Concerns: Doing fine  Was using crestor, but stopped a while back.  Wants to see updated lipids today.    May want to use Red yeast rice going forward.    Reviewed their medical, surgical, family, social, medication, and allergy history and updated chart as appropriate.  Past Medical History:  Diagnosis Date   Asthma    childhood asthma   Hyperlipidemia    Low HDL (under 40) 12/23/2013   RBBB    normal variant on EKG   Seasonal allergic rhinitis    spring    Past Surgical History:  Procedure Laterality Date   ADENOIDECTOMY      Family History  Problem Relation Age of Onset   Thyroid disease Mother    COPD Mother    Hypertension Father    Asthma Brother    Diabetes Maternal Aunt    Cancer Maternal Uncle 45       rectal   Rectal cancer Maternal Grandmother    Colon cancer Maternal Grandmother    Cancer Maternal Grandmother        colorectal cancer   Diabetes Maternal Grandfather    Heart disease Paternal Grandmother        CHF   Cancer Paternal Grandfather        lung   Heart disease Paternal Grandfather    Stroke Neg Hx    Esophageal cancer Neg Hx    Stomach cancer Neg Hx      Current Outpatient Medications:    Finasteride-Minoxidil 0.1-7 % SOLN, Apply 1 spray topically. daily, Disp: , Rfl:    rosuvastatin (CRESTOR) 5 MG tablet, Take 1 tablet (5 mg total) by mouth every other day., Disp: 90 tablet, Rfl: 2  No Known Allergies   Review of Systems Constitutional: -fever, -chills, -sweats, -unexpected weight change, -decreased appetite, -fatigue Allergy: -sneezing, -itching, -congestion Dermatology: -changing moles, --rash, -lumps ENT: -runny nose, -ear pain, -sore throat,  -hoarseness, -sinus pain, -teeth pain, - ringing in ears, -hearing loss, -nosebleeds Cardiology: -chest pain, -palpitations, -swelling, -difficulty breathing when lying flat, -waking up short of breath Respiratory: -cough, -shortness of breath, -difficulty breathing with exercise or exertion, -wheezing, -coughing up blood Gastroenterology: -abdominal pain, -nausea, -vomiting, -diarrhea, -constipation, -blood in stool, -changes in bowel movement, -difficulty swallowing or eating Hematology: -bleeding, -bruising  Musculoskeletal: -joint aches, -muscle aches, -joint swelling, -back pain, -neck pain, -cramping, -changes in gait Ophthalmology: denies vision changes, eye redness, itching, discharge Urology: -burning with urination, -difficulty urinating, -blood in urine, -urinary frequency, -urgency, -incontinence Neurology: -headache, -weakness, -tingling, -numbness, -memory loss, -falls, -dizziness Psychology: -depressed mood, -agitation, -sleep problems Male GU: no testicular mass, pain, no lymph nodes swollen, no swelling, no rash.       11/24/2023    3:04 PM 11/22/2022    3:18 PM 11/08/2021    3:33 PM 07/11/2020   12:17 PM 03/16/2019   10:42 AM  Depression screen PHQ 2/9  Decreased Interest 0 0 0 0 0  Down, Depressed, Hopeless 0 0 0 0 0  PHQ - 2 Score 0 0 0 0 0        Objective:  BP 120/76   Pulse 78  Ht 5' 9.5" (1.765 m)   Wt 212 lb 9.6 oz (96.4 kg)   BMI 30.95 kg/m   General appearance: alert, no distress, WD/WN, Caucasian male Skin: scattered macules, no worrisome lesions HEENT: normocephalic, conjunctiva/corneas normal, sclerae anicteric, PERRLA, EOMi Neck: supple, no lymphadenopathy, no thyromegaly, no masses, normal ROM, no bruits Chest: non tender, normal shape and expansion Heart: RRR, normal S1, S2, no murmurs Lungs: CTA bilaterally, no wheezes, rhonchi, or rales Abdomen: +bs, soft, non tender, non distended, no masses, no hepatomegaly, no splenomegaly, no  bruits Back: non tender, normal ROM, no scoliosis Musculoskeletal:  unremarkable limited exam Extremities: no edema, no cyanosis, no clubbing Pulses: 2+ symmetric, upper and lower extremities, normal cap refill Neurological: alert, oriented x 3, CN2-12 intact, strength normal upper extremities and lower extremities, sensation normal throughout, DTRs 2+ throughout, no cerebellar signs, gait normal Psychiatric: normal affect, behavior normal, pleasant  GU: normal male external genitalia,circumcised, nontender, no masses, no hernia, no lymphadenopathy Rectal: deferred    Assessment and Plan :   Encounter Diagnoses  Name Primary?   Routine general medical examination at a health care facility Yes   Screening for lipid disorders     This visit was a preventative care visit, also known as wellness visit or routine physical.   Topics typically include healthy lifestyle, diet, exercise, preventative care, vaccinations, sick and well care, proper use of emergency dept and after hours care, as well as other concerns.     Recommendations: Continue to return yearly for your annual wellness and preventative care visits.  This gives [place] a chance to discuss healthy lifestyle, exercise, vaccinations, review your chart record, and perform screenings where appropriate.  I recommend you see your eye doctor yearly for routine vision care.  I recommend you see your dentist yearly for routine dental care including hygiene visits twice yearly.   Vaccination recommendations were reviewed Immunization History  Administered Date(s) Administered   Hepatitis B 10/14/2012, 11/11/2012, 03/08/2013   Influenza Split 10/07/2015   Influenza,inj,Quad PF,6+ Mos 10/19/2020, 11/08/2021, 11/22/2022   Influenza-Unspecified 10/07/2014, 09/22/2018, 08/24/2019   Moderna Covid-19 Vaccine Bivalent Booster 24yrs & up 11/08/2021   Moderna Sars-Covid-2 Vaccination 12/27/2019, 01/29/2020, 10/19/2020   Pfizer(Comirnaty)Fall  Seasonal Vaccine 12 years and older 11/22/2022   Td 02/25/2017   Tdap 09/03/2007     Screening for cancer: Colon cancer screening: I reviewed your 2023 colonoscopy, repeat in 3 years  Testicular cancer screening You should do a monthly self testicular exam if you are between 87-15 years old  We discussed PSA, prostate exam, and prostate cancer screening risks/benefits.   Age 2  Skin cancer screening: Check your skin regularly for new changes, growing lesions, or other lesions of concern Come in for evaluation if you have skin lesions of concern.  Lung cancer screening: If you have a greater than 20 pack year history of tobacco use, then you may qualify for lung cancer screening with a chest CT scan.   Please call your insurance company to inquire about coverage for this test.  We currently don't have screenings for other cancers besides breast, cervical, colon, and lung cancers.  If you have a strong family history of cancer or have other cancer screening concerns, please let me know.    Bone health: Get at least 150 minutes of aerobic exercise weekly Get weight bearing exercise at least once weekly Bone density test:  A bone density test is an imaging test that uses a type of X-ray to measure the amount of  calcium and other minerals in your bones. The test may be used to diagnose or screen you for a condition that causes weak or thin bones (osteoporosis), predict your risk for a broken bone (fracture), or determine how well your osteoporosis treatment is working. The bone density test is recommended for females 65 and older, or females or males <65 if certain risk factors such as thyroid disease, long term use of steroids such as for asthma or rheumatological issues, vitamin D deficiency, estrogen deficiency, family history of osteoporosis, self or family history of fragility fracture in first degree relative.    Heart health: Get at least 150 minutes of aerobic exercise  weekly Limit alcohol It is important to maintain a healthy blood pressure and healthy cholesterol numbers  Heart disease screening: Screening for heart disease includes screening for blood pressure, fasting lipids, glucose/diabetes screening, BMI height to weight ratio, reviewed of smoking status, physical activity, and diet.    Goals include blood pressure 120/80 or less, maintaining a healthy lipid/cholesterol profile, preventing diabetes or keeping diabetes numbers under good control, not smoking or using tobacco products, exercising most days per week or at least 150 minutes per week of exercise, and eating healthy variety of fruits and vegetables, healthy oils, and avoiding unhealthy food choices like fried food, fast food, high sugar and high cholesterol foods.    Other tests may possibly include EKG test, CT coronary calcium score, echocardiogram, exercise treadmill stress test.   Your 2022 CT coronary scan shows mild cholesterol plaque in 1 coronary and no lung lesions.     Medical care options: I recommend you continue to seek care here first for routine care.  We try really hard to have available appointments Monday through Friday daytime hours for sick visits, acute visits, and physicals.  Urgent care should be used for after hours and weekends for significant issues that cannot wait till the next day.  The emergency department should be used for significant potentially life-threatening emergencies.  The emergency department is expensive, can often have long wait times for less significant concerns, so try to utilize primary care, urgent care, or telemedicine when possible to avoid unnecessary trips to the emergency department.  Virtual visits and telemedicine have been introduced since the pandemic started in 2020, and can be convenient ways to receive medical care.  We offer virtual appointments as well to assist you in a variety of options to seek medical care.    Separate  significant issues discussed: BMI > 30 work on efforts to lose weight through health diet and exercise  Abnormal lipids- recheck fasting lipids today.  Consider going back on crestor medicaiton given mild atherosclerosis on 2022 CT coronary test  Asthma-no recent problems.  I reviewed his [facility] screening from 03/2022 showing normal echocardiogram, normal thyroid ultrasound, normal abdominal ultrasound, normal carotid US.   RBBB - unchanged on 2022 EKG    Cory "Asher Muir" was seen today for annual exam.  Diagnoses and all orders for this visit:  Routine general medical examination at a health care facility -     Comprehensive metabolic panel -     CBC with Differential/Platelet -     Lipid panel  Screening for lipid disorders -     Lipid panel    Follow-up pending labs, yearly for physical

## 2023-11-25 ENCOUNTER — Other Ambulatory Visit: Payer: Self-pay | Admitting: Medical

## 2023-11-25 LAB — CBC WITH DIFFERENTIAL/PLATELET
Basophils Absolute: 0.1 10*3/uL (ref 0.0–0.2)
Basos: 1 %
EOS (ABSOLUTE): 0.1 10*3/uL (ref 0.0–0.4)
Eos: 1 %
Hematocrit: 49.2 % (ref 37.5–51.0)
Hemoglobin: 16.5 g/dL (ref 13.0–17.7)
Immature Grans (Abs): 0 10*3/uL (ref 0.0–0.1)
Immature Granulocytes: 0 %
Lymphocytes Absolute: 3 10*3/uL (ref 0.7–3.1)
Lymphs: 32 %
MCH: 30.3 pg (ref 26.6–33.0)
MCHC: 33.5 g/dL (ref 31.5–35.7)
MCV: 90 fL (ref 79–97)
Monocytes Absolute: 0.7 10*3/uL (ref 0.1–0.9)
Monocytes: 7 %
Neutrophils Absolute: 5.6 10*3/uL (ref 1.4–7.0)
Neutrophils: 59 %
Platelets: 261 10*3/uL (ref 150–450)
RBC: 5.44 x10E6/uL (ref 4.14–5.80)
RDW: 12.4 % (ref 11.6–15.4)
WBC: 9.5 10*3/uL (ref 3.4–10.8)

## 2023-11-25 LAB — COMPREHENSIVE METABOLIC PANEL
ALT: 34 IU/L (ref 0–44)
AST: 25 IU/L (ref 0–40)
Albumin: 4.7 g/dL (ref 4.1–5.1)
Alkaline Phosphatase: 88 IU/L (ref 44–121)
BUN/Creatinine Ratio: 12 (ref 9–20)
BUN: 12 mg/dL (ref 6–20)
Bilirubin Total: 0.4 mg/dL (ref 0.0–1.2)
CO2: 20 mmol/L (ref 20–29)
Calcium: 9.1 mg/dL (ref 8.7–10.2)
Chloride: 102 mmol/L (ref 96–106)
Creatinine, Ser: 1 mg/dL (ref 0.76–1.27)
Globulin, Total: 2.2 g/dL (ref 1.5–4.5)
Glucose: 78 mg/dL (ref 70–99)
Potassium: 4.2 mmol/L (ref 3.5–5.2)
Sodium: 139 mmol/L (ref 134–144)
Total Protein: 6.9 g/dL (ref 6.0–8.5)
eGFR: 99 mL/min/{1.73_m2} (ref >59)

## 2023-11-25 LAB — LIPID PANEL
Cholesterol, Total: 229 mg/dL — ABNORMAL HIGH (ref 100–199)
HDL: 43 mg/dL (ref >39)
LDL CALC COMMENT:: 5.3 ratio — ABNORMAL HIGH (ref 0.0–5.0)
LDL Chol Calc (NIH): 147 mg/dL — ABNORMAL HIGH (ref 0–99)
Triglycerides: 216 mg/dL — ABNORMAL HIGH (ref 0–149)
VLDL Cholesterol Cal: 39 mg/dL (ref 5–40)

## 2023-11-25 MED ORDER — ROSUVASTATIN CALCIUM 5 MG PO TABS: 5.0000 mg | ORAL_TABLET | ORAL | 2 refills | Status: DC

## 2023-11-25 NOTE — Progress Notes (Signed)
Results sent through MyChart

## 2023-11-27 ENCOUNTER — Other Ambulatory Visit: Payer: Self-pay

## 2023-11-27 MED ORDER — ROSUVASTATIN CALCIUM 5 MG PO TABS: 5.0000 mg | ORAL_TABLET | ORAL | 2 refills | Status: DC

## 2024-12-02 ENCOUNTER — Encounter: Payer: Self-pay | Admitting: Medical

## 2024-12-02 ENCOUNTER — Ambulatory Visit: Payer: 59 | Admitting: Medical

## 2024-12-02 VITALS — BP 120/68 | HR 83 | Ht 69.5 in | Wt 215.6 lb

## 2024-12-02 DIAGNOSIS — Z0000 Encounter for general adult medical examination without abnormal findings: Secondary | ICD-10-CM | POA: Insufficient documentation

## 2024-12-02 DIAGNOSIS — Z6831 Body mass index (BMI) 31.0-31.9, adult: Secondary | ICD-10-CM | POA: Insufficient documentation

## 2024-12-02 DIAGNOSIS — Z13220 Encounter for screening for lipoid disorders: Secondary | ICD-10-CM

## 2024-12-02 DIAGNOSIS — E785 Hyperlipidemia, unspecified: Secondary | ICD-10-CM | POA: Diagnosis not present

## 2024-12-02 DIAGNOSIS — R351 Nocturia: Secondary | ICD-10-CM | POA: Diagnosis not present

## 2024-12-02 DIAGNOSIS — Z1389 Encounter for screening for other disorder: Secondary | ICD-10-CM | POA: Insufficient documentation

## 2024-12-02 DIAGNOSIS — I4510 Unspecified right bundle-branch block: Secondary | ICD-10-CM | POA: Diagnosis not present

## 2024-12-02 DIAGNOSIS — Z809 Family history of malignant neoplasm, unspecified: Secondary | ICD-10-CM | POA: Diagnosis not present

## 2024-12-02 DIAGNOSIS — R002 Palpitations: Secondary | ICD-10-CM | POA: Insufficient documentation

## 2024-12-02 DIAGNOSIS — R6882 Decreased libido: Secondary | ICD-10-CM | POA: Insufficient documentation

## 2024-12-02 DIAGNOSIS — J4520 Mild intermittent asthma, uncomplicated: Secondary | ICD-10-CM

## 2024-12-02 DIAGNOSIS — Z125 Encounter for screening for malignant neoplasm of prostate: Secondary | ICD-10-CM | POA: Insufficient documentation

## 2024-12-02 NOTE — Progress Notes (Signed)
 Subjective:   HPI  Cory Barron is a 38 y.o. male who presents for Chief Complaint  Patient presents with   Annual Exam    Fasting cpe, has some flutters in chest that has been going on every other day. Would like to have his testosterone checked. Urinating more than normal at night    Patient Care Team: Kenidee Cregan, Alm GORMAN RIGGERS as PCP - General Specialists, Dermatology (Dermatology) Stacia Glendia BRAVO, MD as Consulting Physician (Gastroenterology) Sees dentist Sees eye doctor    Concerns: Cory Barron is a 38 year old male who presents with chest flutters.  He experiences chest flutters approximately two to three times per week, primarily in the evenings, with each episode lasting about ten to fifteen minutes. These symptoms have been present for about a month. He speculates that the flutters might be related to stress from daily activities, although he does not feel stressed. He does not use a smartwatch to monitor his heart rate.  He reports increased nocturnal urination, typically once per night, over the past six months. He does not consume excessive fluids or caffeine close to bedtime.  He mentions a decrease in energy and desire, which he associates with potential low testosterone levels as he approaches [age]. He denies significant issues with erections but notes a general decrease in energy and desire.  He is currently taking Crestor  for cholesterol management and uses a combination of finasteride and minoxidil solution for hair loss, which he has been using for about a year with noticeable improvement according to his wife.  His past medical history includes adenoid surgery and a family history of polyps, which has led to a recommendation for a colonoscopy every two to three years. He had a polyp found during his last colonoscopy.  He does not smoke and consumes less than one alcoholic drink per week. He is active with his children but does not follow a  rigorous exercise program. He works with a [employer] and travels frequently.   Reviewed their medical, surgical, family, social, medication, and allergy history and updated chart as appropriate.  Past Medical History:  Diagnosis Date   Asthma    childhood asthma   Hyperlipidemia    Low HDL (under 40) 12/23/2013   RBBB    normal variant on EKG   Seasonal allergic rhinitis    spring    Past Surgical History:  Procedure Laterality Date   ADENOIDECTOMY     COLONOSCOPY  01/2022   Dr. Glendia Stacia; repeat 2026.    Family History  Problem Relation Age of Onset   Thyroid disease Mother    COPD Mother    Hypertension Father    Asthma Brother    Diabetes Maternal Aunt    Cancer Maternal Uncle 45       rectal   Rectal cancer Maternal Grandmother    Colon cancer Maternal Grandmother    Cancer Maternal Grandmother        colorectal cancer   Diabetes Maternal Grandfather    Heart disease Paternal Grandmother        CHF   Cancer Paternal Grandfather        lung   Heart disease Paternal Grandfather    Stroke Neg Hx    Esophageal cancer Neg Hx    Stomach cancer Neg Hx      Current Outpatient Medications:    Finasteride-Minoxidil 0.1-7 % SOLN, Apply 1 spray topically. daily, Disp: , Rfl:    rosuvastatin  (CRESTOR ) 5  MG tablet, Take 1 tablet (5 mg total) by mouth every other day., Disp: 90 tablet, Rfl: 2  No Known Allergies   Review of Systems  Constitutional:  Positive for malaise/fatigue. Negative for chills, fever and weight loss.  HENT:  Negative for congestion, ear pain, hearing loss, sore throat and tinnitus.   Eyes:  Negative for blurred vision, pain and redness.  Respiratory:  Negative for cough, hemoptysis and shortness of breath.   Cardiovascular:  Positive for palpitations. Negative for chest pain, orthopnea, claudication and leg swelling.  Gastrointestinal:  Negative for abdominal pain, blood in stool, constipation, diarrhea, nausea and vomiting.   Genitourinary:  Positive for frequency. Negative for dysuria, flank pain, hematuria and urgency.       Nocturia   Musculoskeletal:  Negative for falls, joint pain and myalgias.  Skin:  Negative for itching and rash.  Neurological:  Negative for dizziness, tingling, speech change, weakness and headaches.  Endo/Heme/Allergies:  Negative for polydipsia. Does not bruise/bleed easily.  Psychiatric/Behavioral:  Negative for depression and memory loss. The patient is not nervous/anxious and does not have insomnia.          12/02/2024    8:14 AM 11/24/2023    3:04 PM 11/22/2022    3:18 PM 11/08/2021    3:33 PM 07/11/2020   12:17 PM  Depression screen PHQ 2/9  Decreased Interest 0 0 0 0 0  Down, Depressed, Hopeless 0 0 0 0 0  PHQ - 2 Score 0 0 0 0 0        Objective:  BP 120/68   Pulse 83   Ht 5' 9.5 (1.765 m)   Wt 215 lb 9.6 oz (97.8 kg)   SpO2 98%   BMI 31.38 kg/m   General appearance: alert, no distress, WD/WN, Caucasian male Skin: scattered macules, no worrisome lesions HEENT: normocephalic, conjunctiva/corneas normal, sclerae anicteric, PERRLA, EOMi Neck: supple, no lymphadenopathy, no thyromegaly, no masses, normal ROM, no bruits Chest: non tender, normal shape and expansion Heart: RRR, normal S1, S2, no murmurs Lungs: CTA bilaterally, no wheezes, rhonchi, or rales Abdomen: +bs, soft, non tender, non distended, no masses, no hepatomegaly, no splenomegaly, no bruits Back: non tender, normal ROM, no scoliosis Musculoskeletal:  unremarkable limited exam Extremities: no edema, no cyanosis, no clubbing Pulses: 2+ symmetric, upper and lower extremities, normal cap refill Neurological: alert, oriented x 3, CN2-12 intact, strength normal upper extremities and lower extremities, sensation normal throughout, DTRs 2+ throughout, no cerebellar signs, gait normal Psychiatric: normal affect, behavior normal, pleasant  GU: normal male external genitalia,circumcised, nontender, no  masses, no hernia, no lymphadenopathy Rectal: deferred/declined   Assessment and Plan :   Encounter Diagnoses  Name Primary?   Encounter for health maintenance examination in adult Yes   Fluttering sensation of heart    Encounter for lipid screening for cardiovascular disease    Screening for hematuria or proteinuria    Screening for prostate cancer    Palpitation    Nocturia    Family history of cancer    Low libido    RBBB    Mild intermittent asthma, unspecified whether complicated    Hyperlipidemia, unspecified hyperlipidemia type    BMI 31.0-31.9,adult     This visit was a preventative care visit, also known as wellness visit or routine physical.   Topics typically include healthy lifestyle, diet, exercise, preventative care, vaccinations, sick and well care, proper use of emergency dept and after hours care, as well as other concerns.  Recommendations: Continue to return yearly for your annual wellness and preventative care visits.  This gives us  a chance to discuss healthy lifestyle, exercise, vaccinations, review your chart record, and perform screenings where appropriate.  I recommend you see your eye doctor yearly for routine vision care.  I recommend you see your dentist yearly for routine dental care including hygiene visits twice yearly.   Vaccination recommendations were reviewed Immunization History  Administered Date(s) Administered   Hepatitis B 10/14/2012, 11/11/2012, 03/08/2013   Influenza Split 10/07/2015   Influenza,inj,Quad PF,6+ Mos 10/19/2020, 11/08/2021, 11/22/2022   Influenza-Unspecified 10/07/2014, 09/22/2018, 08/24/2019   Moderna Covid-19 Vaccine Bivalent Booster 45yrs & up 11/08/2021   Moderna Sars-Covid-2 Vaccination 12/27/2019, 01/29/2020, 10/19/2020   Pfizer(Comirnaty)Fall Seasonal Vaccine 12 years and older 11/22/2022   Td 02/25/2017   Tdap 09/03/2007     Screening for cancer: Colon cancer screening: I reviewed your 2023  colonoscopy, repeat in 2026 as planned  Testicular cancer screening You should do a monthly self testicular exam if you are between 40-21 years old  We discussed PSA, prostate exam, and prostate cancer screening risks/benefits.    Skin cancer screening: Check your skin regularly for new changes, growing lesions, or other lesions of concern Come in for evaluation if you have skin lesions of concern.  Lung cancer screening: If you have a greater than 20 pack year history of tobacco use, then you may qualify for lung cancer screening with a chest CT scan.   Please call your insurance company to inquire about coverage for this test.  We currently don't have screenings for other cancers besides breast, cervical, colon, and lung cancers.  If you have a strong family history of cancer or have other cancer screening concerns, please let me know.    Bone health: Get at least 150 minutes of aerobic exercise weekly Get weight bearing exercise at least once weekly Bone density test:  A bone density test is an imaging test that uses a type of X-ray to measure the amount of calcium  and other minerals in your bones. The test may be used to diagnose or screen you for a condition that causes weak or thin bones (osteoporosis), predict your risk for a broken bone (fracture), or determine how well your osteoporosis treatment is working. The bone density test is recommended for females 65 and older, or females or males <65 if certain risk factors such as thyroid disease, long term use of steroids such as for asthma or rheumatological issues, vitamin D deficiency, estrogen deficiency, family history of osteoporosis, self or family history of fragility fracture in first degree relative.    Heart health: Get at least 150 minutes of aerobic exercise weekly Limit alcohol It is important to maintain a healthy blood pressure and healthy cholesterol numbers  Heart disease screening: Screening for heart disease  includes screening for blood pressure, fasting lipids, glucose/diabetes screening, BMI height to weight ratio, reviewed of smoking status, physical activity, and diet.    Goals include blood pressure 120/80 or less, maintaining a healthy lipid/cholesterol profile, preventing diabetes or keeping diabetes numbers under good control, not smoking or using tobacco products, exercising most days per week or at least 150 minutes per week of exercise, and eating healthy variety of fruits and vegetables, healthy oils, and avoiding unhealthy food choices like fried food, fast food, high sugar and high cholesterol foods.    Other tests may possibly include EKG test, CT coronary calcium  score, echocardiogram, exercise treadmill stress test.   Your 2022 CT  coronary scan shows mild cholesterol plaque in 1 coronary and no lung lesions.     Medical care options: I recommend you continue to seek care here first for routine care.  We try really hard to have available appointments Monday through Friday daytime hours for sick visits, acute visits, and physicals.  Urgent care should be used for after hours and weekends for significant issues that cannot wait till the next day.  The emergency department should be used for significant potentially life-threatening emergencies.  The emergency department is expensive, can often have long wait times for less significant concerns, so try to utilize primary care, urgent care, or telemedicine when possible to avoid unnecessary trips to the emergency department.  Virtual visits and telemedicine have been introduced since the pandemic started in 2020, and can be convenient ways to receive medical care.  We offer virtual appointments as well to assist you in a variety of options to seek medical care.    Separate significant issues discussed: BMI > 30 work on efforts to lose weight through health diet and exercise.  He is interested in possibly pursuing Wegovy or Zepbound medication  for support  Asthma-no recent problems.  RBBB - noted prior and on current EKG  Palpitations Intermittent palpitations possibly due to stress and caffeine. No immediate concerns from evaluation. - Ordered blood work: thyroid, electrolytes, magnesium levels. - Consider event monitor or Xio patch if labs normal.  Nocturia Increased nocturnal urination possibly due to benign prostatic hyperplasia. - Ordered PSA blood test. - Checked urine analysis.  Decreased libido Decreased energy and desire, possibly related to testosterone levels. - Ordered testosterone level test.  Hyperlipidemia Managed with Crestor , no recent issues. - Continue Crestor  as prescribed.   Cory Barron was seen today for annual exam.  Diagnoses and all orders for this visit:  Encounter for health maintenance examination in adult -     CBC -     Comprehensive metabolic panel with GFR -     Lipid panel -     TSH + free T4 -     Magnesium -     Testosterone,Free and Total -     Urinalysis, Routine w reflex microscopic -     PSA  Fluttering sensation of heart -     EKG 12-Lead  Encounter for lipid screening for cardiovascular disease  Screening for hematuria or proteinuria -     Urinalysis, Routine w reflex microscopic  Screening for prostate cancer -     PSA  Palpitation  Nocturia -     PSA  Family history of cancer  Low libido -     Testosterone,Free and Total  RBBB  Mild intermittent asthma, unspecified whether complicated  Hyperlipidemia, unspecified hyperlipidemia type -     Lipid panel  BMI 31.0-31.9,adult    Follow-up pending labs, yearly for physical

## 2024-12-03 ENCOUNTER — Other Ambulatory Visit: Payer: Self-pay | Admitting: Medical

## 2024-12-03 ENCOUNTER — Ambulatory Visit: Payer: Self-pay | Admitting: Medical

## 2024-12-03 DIAGNOSIS — R7989 Other specified abnormal findings of blood chemistry: Secondary | ICD-10-CM

## 2024-12-03 LAB — URINALYSIS, ROUTINE W REFLEX MICROSCOPIC
Bilirubin, UA: NEGATIVE
Glucose, UA: NEGATIVE
Ketones, UA: NEGATIVE
Leukocytes,UA: NEGATIVE
Nitrite, UA: NEGATIVE
Protein,UA: NEGATIVE
RBC, UA: NEGATIVE
Specific Gravity, UA: 1.019 (ref 1.005–1.030)
Urobilinogen, Ur: 1 mg/dL (ref 0.2–1.0)
pH, UA: 7.5 (ref 5.0–7.5)

## 2024-12-03 LAB — COMPREHENSIVE METABOLIC PANEL WITH GFR
ALT: 46 IU/L — ABNORMAL HIGH (ref 0–44)
AST: 25 IU/L (ref 0–40)
Albumin: 4.4 g/dL (ref 4.1–5.1)
Alkaline Phosphatase: 78 IU/L (ref 47–123)
BUN/Creatinine Ratio: 15 (ref 9–20)
BUN: 14 mg/dL (ref 6–20)
Bilirubin Total: 0.5 mg/dL (ref 0.0–1.2)
CO2: 22 mmol/L (ref 20–29)
Calcium: 8.9 mg/dL (ref 8.7–10.2)
Chloride: 105 mmol/L (ref 96–106)
Creatinine, Ser: 0.93 mg/dL (ref 0.76–1.27)
Globulin, Total: 2 g/dL (ref 1.5–4.5)
Glucose: 93 mg/dL (ref 70–99)
Potassium: 4.4 mmol/L (ref 3.5–5.2)
Sodium: 140 mmol/L (ref 134–144)
Total Protein: 6.4 g/dL (ref 6.0–8.5)
eGFR: 108 mL/min/1.73 (ref >59)

## 2024-12-03 LAB — CBC
Hematocrit: 47.9 % (ref 37.5–51.0)
Hemoglobin: 16.1 g/dL (ref 13.0–17.7)
MCH: 31.4 pg (ref 26.6–33.0)
MCHC: 33.6 g/dL (ref 31.5–35.7)
MCV: 93 fL (ref 79–97)
Platelets: 240 x10E3/uL (ref 150–450)
RBC: 5.13 x10E6/uL (ref 4.14–5.80)
RDW: 12.4 % (ref 11.6–15.4)
WBC: 7.3 x10E3/uL (ref 3.4–10.8)

## 2024-12-03 LAB — LIPID PANEL
Chol/HDL Ratio: 3.7 ratio (ref 0.0–5.0)
Cholesterol, Total: 174 mg/dL (ref 100–199)
HDL: 47 mg/dL (ref >39)
LDL Chol Calc (NIH): 112 mg/dL — ABNORMAL HIGH (ref 0–99)
Triglycerides: 83 mg/dL (ref 0–149)
VLDL Cholesterol Cal: 15 mg/dL (ref 5–40)

## 2024-12-03 LAB — TSH+FREE T4
Free T4: 1.18 ng/dL (ref 0.82–1.77)
TSH: 0.436 u[IU]/mL — ABNORMAL LOW (ref 0.450–4.500)

## 2024-12-03 LAB — PSA: Prostate Specific Ag, Serum: 0.4 ng/mL (ref 0.0–4.0)

## 2024-12-03 LAB — MAGNESIUM: Magnesium: 2.3 mg/dL (ref 1.6–2.3)

## 2024-12-03 MED ORDER — ROSUVASTATIN CALCIUM 5 MG PO TABS: 5.0000 mg | ORAL_TABLET | ORAL | 3 refills | Status: AC

## 2024-12-03 MED ORDER — ZEPBOUND 2.5 MG/0.5ML ~~LOC~~ SOAJ: 2.5000 mg | SUBCUTANEOUS | 0 refills | Status: DC

## 2024-12-03 NOTE — Progress Notes (Signed)
 Results through MyChart  Why does it say testosterone needs to be recollected?

## 2024-12-06 LAB — TESTOSTERONE,FREE AND TOTAL
Testosterone, Free: 18.2 pg/mL (ref 8.7–25.1)
Testosterone: 415 ng/dL (ref 264–916)

## 2024-12-06 LAB — SPECIMEN STATUS REPORT

## 2024-12-06 NOTE — Progress Notes (Signed)
 Result through MyChart

## 2024-12-24 ENCOUNTER — Encounter: Payer: Self-pay | Admitting: Gastroenterology

## 2024-12-27 ENCOUNTER — Other Ambulatory Visit: Payer: Self-pay | Admitting: Medical

## 2025-01-03 ENCOUNTER — Other Ambulatory Visit

## 2025-01-03 DIAGNOSIS — R7989 Other specified abnormal findings of blood chemistry: Secondary | ICD-10-CM

## 2025-01-04 ENCOUNTER — Ambulatory Visit (INDEPENDENT_AMBULATORY_CARE_PROVIDER_SITE_OTHER): Admitting: Medical

## 2025-01-04 ENCOUNTER — Ambulatory Visit: Payer: Self-pay | Admitting: Medical

## 2025-01-04 ENCOUNTER — Other Ambulatory Visit: Payer: Self-pay | Admitting: Medical

## 2025-01-04 VITALS — BP 120/70 | HR 101 | Ht 70.0 in | Wt 207.6 lb

## 2025-01-04 DIAGNOSIS — Z6829 Body mass index (BMI) 29.0-29.9, adult: Secondary | ICD-10-CM

## 2025-01-04 DIAGNOSIS — E785 Hyperlipidemia, unspecified: Secondary | ICD-10-CM

## 2025-01-04 DIAGNOSIS — J4520 Mild intermittent asthma, uncomplicated: Secondary | ICD-10-CM | POA: Diagnosis not present

## 2025-01-04 LAB — HEPATIC FUNCTION PANEL
ALT: 34 IU/L (ref 0–44)
AST: 22 IU/L (ref 0–40)
Albumin: 4.7 g/dL (ref 4.1–5.1)
Alkaline Phosphatase: 84 IU/L (ref 47–123)
Bilirubin Total: 0.5 mg/dL (ref 0.0–1.2)
Bilirubin, Direct: 0.2 mg/dL (ref 0.00–0.40)
Total Protein: 6.8 g/dL (ref 6.0–8.5)

## 2025-01-04 LAB — TSH+FREE T4
Free T4: 1.27 ng/dL (ref 0.82–1.77)
TSH: 0.683 u[IU]/mL (ref 0.450–4.500)

## 2025-01-04 MED ORDER — ZEPBOUND 5 MG/0.5ML ~~LOC~~ SOAJ: 5.0000 mg | SUBCUTANEOUS | 0 refills | Status: AC

## 2025-01-04 NOTE — Progress Notes (Signed)
 "  Name: Cory Barron   Date of Visit: 01/04/2025   Date of last visit with me: 12/27/2024   CHIEF COMPLAINT:  Chief Complaint  Patient presents with   Follow-up    Follow-up weight.        HPI:  Discussed the use of AI scribe software for clinical note transcription with the patient, who gave verbal consent to proceed.  History of Present Illness   Cory Barron is a 39 year old male who presents for follow-up regarding weight management and medication review.  He has been on Zepbound  since December 03, 2024, and has completed a month of treatment, resulting in a weight loss of eight pounds. He experienced minor gastrointestinal side effects, primarily constipation during the first two weeks, which have since resolved. No significant nausea is reported.  He does not have a formal exercise routine but is trying to be more active by spending time outside with his children and walking more. Previously, when he worked at [employer], he had a more structured exercise regimen, including an hour of exercise during each shift. Currently, he works at a computer and makes an effort to walk around outside during the day.  His wife is also on the same medication, and she experienced issues with pharmacy stock, although he did not encounter any problems with his prescription. He uses [facility]  for his medication needs.  Recent lab work, including thyroid and liver function tests, was performed in December 2025.   ROS as in subjective  Past Medical History:  Diagnosis Date   Asthma    childhood asthma   Hyperlipidemia    Low HDL (under 40) 12/23/2013   RBBB    normal variant on EKG   Seasonal allergic rhinitis    spring    Medications Ordered Prior to Encounter[1]    OBJECTIVE:    BP 120/70   Pulse (!) 101   Ht 5' 10 (1.778 m)   Wt 207 lb 9.6 oz (94.2 kg)   SpO2 98%   BMI 29.79 kg/m   BP Readings from Last 3 Encounters:  01/04/25 120/70  12/02/24 120/68   11/24/23 120/76    Wt Readings from Last 3 Encounters:  01/04/25 207 lb 9.6 oz (94.2 kg)  12/02/24 215 lb 9.6 oz (97.8 kg)  11/24/23 212 lb 9.6 oz (96.4 kg)    General appearence: alert, no distress, WD/WN,     ASSESSMENT/PLAN:   Encounter Diagnoses  Name Primary?   BMI 29.0-29.9,adult Yes   Mild intermittent asthma, unspecified whether complicated    Hyperlipidemia, unspecified hyperlipidemia type     Overweight BMI 29. Lost 8 pounds with Zepbound . Starter dose.  Minor GI side effects resolved. Discussed Zepbound  dose increase risks. Emphasized exercise and diet for weight management.  - Increased Zepbound  to 5 mg. - Follow-up in two months for med review and weight loss. - Contact before 30 days to discuss 7.5 mg dose readiness. - Advised on side effects management: stool softeners, Miralax, Zofran . - Encouraged specific exercise and dietary goals. - Discussed potential pharmacy issues and alternatives.  General Health Maintenance Discussed exercise and diet for health and weight management. Emphasized benefits of weight-bearing and cardio exercises. - Encouraged exercise 3-4 days a week. - Advised on reducing junk food and sugary drinks. - Encouraged setting specific health goals like pant size or running distance.  Hyperlipidemia -Continue rosuvastatin  Crestor  5 mg daily   Felicia Jamie was seen today for follow-up.  Diagnoses  and all orders for this visit:  BMI 29.0-29.9,adult  Mild intermittent asthma, unspecified whether complicated  Hyperlipidemia, unspecified hyperlipidemia type  Other orders -     tirzepatide  (ZEPBOUND ) 5 MG/0.5ML Pen; Inject 5 mg into the skin once a week.    F/u 2 months   [facility]     [1]  Current Outpatient Medications on File Prior to Visit  Medication Sig Dispense Refill   Finasteride-Minoxidil 0.1-7 % SOLN Apply 1 spray topically. daily     rosuvastatin  (CRESTOR ) 5 MG  tablet Take 1 tablet (5 mg total) by mouth every other day. 90 tablet 3   No current facility-administered medications on file prior to visit.   "

## 2025-01-04 NOTE — Progress Notes (Signed)
 Results through MyChart

## 2025-01-19 ENCOUNTER — Ambulatory Visit

## 2025-01-19 ENCOUNTER — Encounter: Payer: Self-pay | Admitting: Gastroenterology

## 2025-01-19 VITALS — Ht 70.0 in | Wt 200.0 lb

## 2025-01-19 DIAGNOSIS — Z860100 Personal history of colon polyps, unspecified: Secondary | ICD-10-CM

## 2025-01-19 DIAGNOSIS — Z800 Family history of malignant neoplasm of digestive organs: Secondary | ICD-10-CM

## 2025-01-19 MED ORDER — SUTAB 1479-225-188 MG PO TABS: ORAL_TABLET | ORAL | 0 refills | Status: AC

## 2025-01-19 NOTE — Progress Notes (Signed)
 RN confirmed patient name, date of birth, and address RN confirmed date and time of procedure RN reviewed and confirmed allergies  RN reviewed and updated current medications; confirmed preferred pharmacy Pt is not on diet pills; does take GLP-1 medication Pt is not on blood thinners RN reviewed medical & surgical hx  Pt denies issues with chronic constipation  Diabetic - no No A fib or A flutter No cardiac tests are pending  Pt is not on home 02  No issues known with past sedation with any surgeries or procedures Patient denies ever being told they had issues or difficulty with intubation  Patient unaware of any fh of malignant hyperthermia Ambulates independently RN reviewed prep instructions and explained time frames for holding certain medications RN answered patient questions; patient stated understanding Prep instructions sent via My Chart per patient request

## 2025-01-25 ENCOUNTER — Other Ambulatory Visit: Payer: Self-pay | Admitting: Medical

## 2025-01-25 MED ORDER — ZEPBOUND 7.5 MG/0.5ML ~~LOC~~ SOAJ: 7.5000 mg | SUBCUTANEOUS | 0 refills | Status: AC

## 2025-02-02 ENCOUNTER — Encounter: Admitting: Gastroenterology

## 2025-03-07 ENCOUNTER — Ambulatory Visit: Admitting: Medical

## 2025-12-08 ENCOUNTER — Encounter: Admitting: Medical
